# Patient Record
Sex: Male | Born: 1985
Health system: Southern US, Community
[De-identification: ages and names within clinical notes are randomized; demographics above are authoritative.]

## PROBLEM LIST (undated history)

## (undated) DIAGNOSIS — D229 Melanocytic nevi, unspecified: Secondary | ICD-10-CM

## (undated) DIAGNOSIS — B029 Zoster without complications: Secondary | ICD-10-CM

## (undated) HISTORY — DX: Zoster without complications: B02.9

## (undated) HISTORY — DX: Melanocytic nevi, unspecified: D22.9

---

## 2010-05-25 ENCOUNTER — Ambulatory Visit (INDEPENDENT_AMBULATORY_CARE_PROVIDER_SITE_OTHER): Payer: BC Managed Care – PPO

## 2010-05-25 ENCOUNTER — Inpatient Hospital Stay (INDEPENDENT_AMBULATORY_CARE_PROVIDER_SITE_OTHER)
Admission: RE | Admit: 2010-05-25 | Discharge: 2010-05-25 | Disposition: A | Discharge status: Home / Self Care | Payer: BC Managed Care – PPO | Source: Ambulatory Visit | Attending: Family Medicine | Admitting: Family Medicine

## 2010-05-25 DIAGNOSIS — S0280XA Fracture of other specified skull and facial bones, unspecified side, initial encounter for closed fracture: Secondary | ICD-10-CM

## 2010-05-31 ENCOUNTER — Other Ambulatory Visit: Payer: Self-pay | Admitting: Otolaryngology

## 2010-05-31 ENCOUNTER — Ambulatory Visit
Admission: RE | Admit: 2010-05-31 | Discharge: 2010-05-31 | Disposition: A | Discharge status: Home / Self Care | Payer: BC Managed Care – PPO | Source: Ambulatory Visit | Attending: Otolaryngology | Admitting: Otolaryngology

## 2010-05-31 DIAGNOSIS — T148XXA Other injury of unspecified body region, initial encounter: Secondary | ICD-10-CM

## 2010-06-09 ENCOUNTER — Ambulatory Visit (HOSPITAL_BASED_OUTPATIENT_CLINIC_OR_DEPARTMENT_OTHER)
Admission: RE | Admit: 2010-06-09 | Discharge: 2010-06-09 | Disposition: A | Discharge status: Home / Self Care | Payer: BC Managed Care – PPO | Source: Ambulatory Visit | Attending: Otolaryngology | Admitting: Otolaryngology

## 2010-06-09 DIAGNOSIS — W219XXA Striking against or struck by unspecified sports equipment, initial encounter: Secondary | ICD-10-CM | POA: Insufficient documentation

## 2010-06-09 DIAGNOSIS — Z01812 Encounter for preprocedural laboratory examination: Secondary | ICD-10-CM | POA: Insufficient documentation

## 2010-06-09 DIAGNOSIS — Y9364 Activity, baseball: Secondary | ICD-10-CM | POA: Insufficient documentation

## 2010-06-09 DIAGNOSIS — Y998 Other external cause status: Secondary | ICD-10-CM | POA: Insufficient documentation

## 2010-06-09 DIAGNOSIS — S022XXA Fracture of nasal bones, initial encounter for closed fracture: Secondary | ICD-10-CM | POA: Insufficient documentation

## 2010-06-09 DIAGNOSIS — S0280XA Fracture of other specified skull and facial bones, unspecified side, initial encounter for closed fracture: Secondary | ICD-10-CM | POA: Insufficient documentation

## 2010-06-20 NOTE — Op Note (Signed)
  Brian Brady, Brian Brady                  ACCOUNT NO.:  [id number]  MEDICAL RECORD NO.:  [id number]          PATIENT TYPE:  LOCATION:                                 FACILITY:  PHYSICIAN:  Antony Contras, MD     DATE OF BIRTH:  1985-04-19  DATE OF PROCEDURE:  06/09/2010 DATE OF DISCHARGE:                              OPERATIVE REPORT   PREOPERATIVE DIAGNOSES: 1. Displaced nasal fracture. 2. Right infraorbital fracture.  POSTOPERATIVE DIAGNOSES: 1. Displaced nasal fracture. 2. Right infraorbital fracture.  PROCEDURE:  Closed nasal reduction.  SURGEON:  Antony Contras, MD  ANESTHESIA:  General LMA.  COMPLICATIONS:  None.  INDICATIONS:  The patient is a 25 year old white male who was struck in the face by a softball a couple of weeks ago.  He sustained a displaced nasal fracture as well as a partial tripod fracture on the right side. The contour of the maxillary bones appeared to be fairly symmetric, but he does have a step-off of the infraorbital rim.  His nose was displaced toward the left.  After discussing options of what to do with the tripod fracture, the patient has elected to proceed only with closed nasal reduction of the nasal fracture and not going through with open reduction and internal fixation of the maxillary fracture.  Thus, he presents to the operating room for surgical management.  FINDINGS:  The right nasal bone was the depressed creating a left-sided appearance to the nasal dorsum.  The right infraorbital step-off was palpable.  DESCRIPTION OF PROCEDURE:  The patient was identified in the holding room and informed consent having been obtained including discussion of risks, benefits, and alternatives, the patient was brought to the office suite and put on the operating room in supine position.  Anesthesia was induced and the patient was intubated with an LMA without difficulty. The eyes were taped closed and Afrin pledgets were placed on both sides of the  nose.  The right infraorbital fracture was then manipulated with the [facility] elevator in the nose and thumb against the fracture and was able to be reduced somewhat making it less conspicuous.  The pledgets were removed from the nose and the [facility] elevator with bimanual manipulation was used to elevate the right nasal bone back into normal position.  The left bones did not really move.  The right bone though still fell into the nose after manipulating it.  Thus, a small Merocel pack was placed under the nasal bone and inflated with saline.  The external nose was then coated in benzoin followed by a custom cut Steri-Strips and a thermoplastic splint that was cut to fit the nose and placed in hot water until malleable. The nose was suctioned.  The patient was turned to Anesthesia for wake- up.  He was extubated and moved to the recovery room in stable condition.     Antony Contras, MD     DDB/MEDQ  D:  06/09/2010  T:  06/10/2010  Job:  [id number]  Electronically Signed by Christia Reading MD on 06/20/2010 08:51:58 PM

## 2011-02-06 HISTORY — PX: WISDOM TOOTH EXTRACTION: SHX21

## 2012-11-01 IMAGING — CT CT MAXILLOFACIAL W/O CM
3 of 4 series · 16 of 47 positions shown, 19 images · non-contrast
Comparison: Radiography [DATE]

CLINICAL DATA: Right maxillary and nasal fracture.

CT MAXILLOFACIAL WITHOUT CONTRAST
TECHNIQUE: Multidetector CT imaging of the maxillofacial
structures was performed. Multiplanar CT image reconstructions were
also generated.

[Series 3: max bone · axial · 0.33mm/px · z∈[-48,+102]mm · 10 of 72 slices shown, 13 images]
[im 6/72  brain]
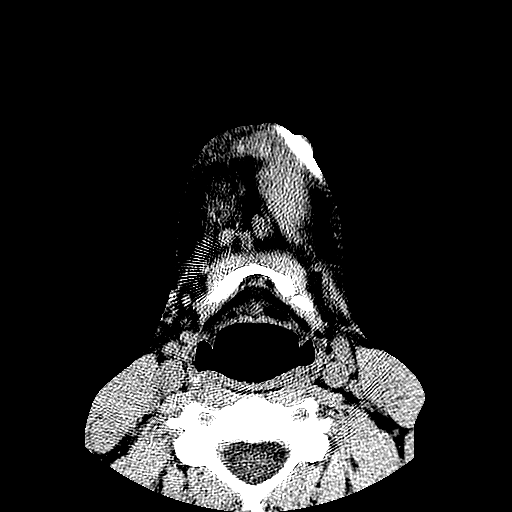
[im 6/72  bone]
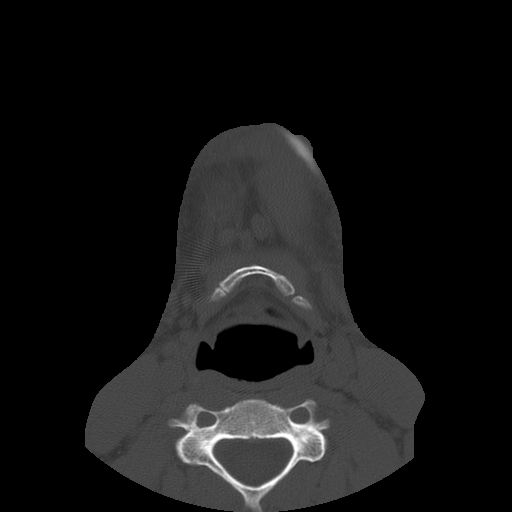
[im 11/72  bone]
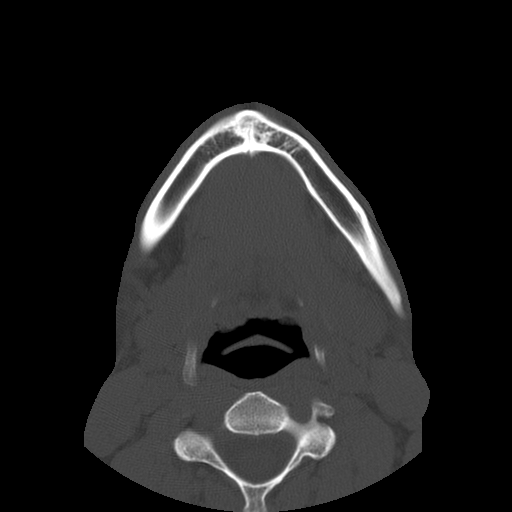
[im 21/72  bone]
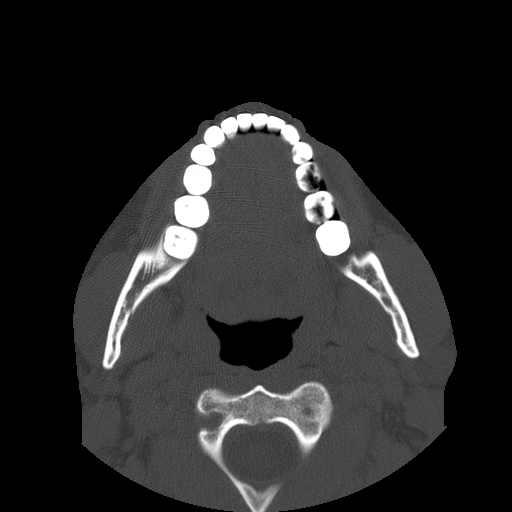
[im 26/72  bone]
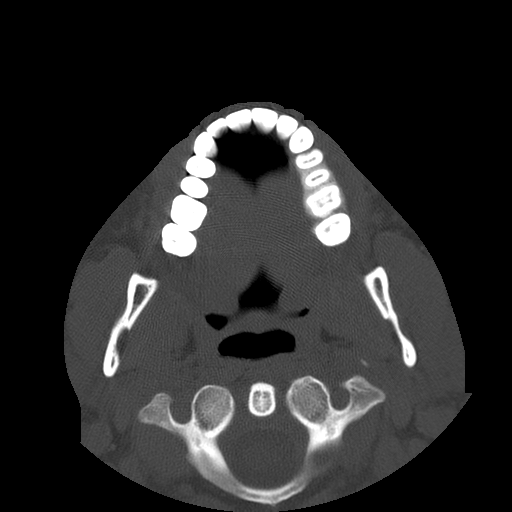
[im 31/72  brain]
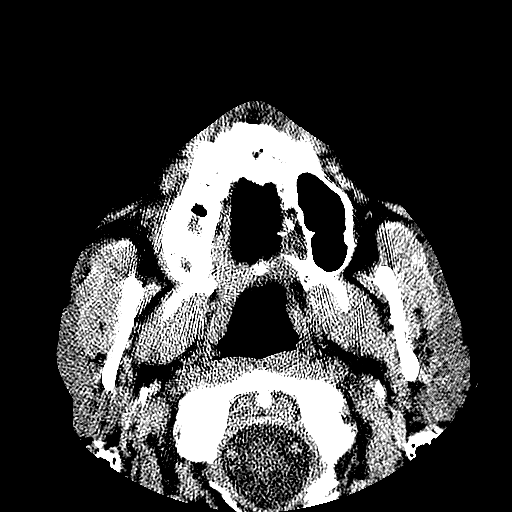
[im 31/72  bone]
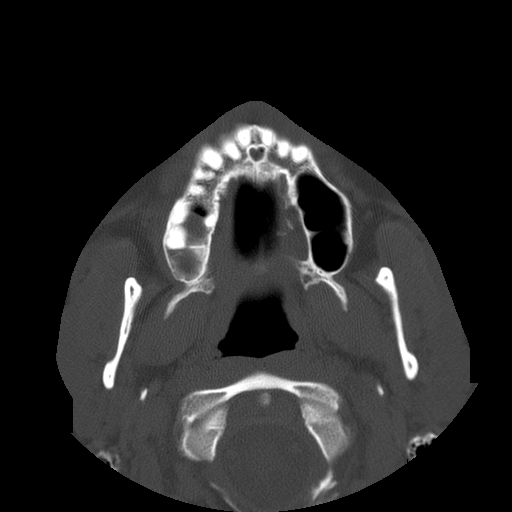
[im 41/72  bone]
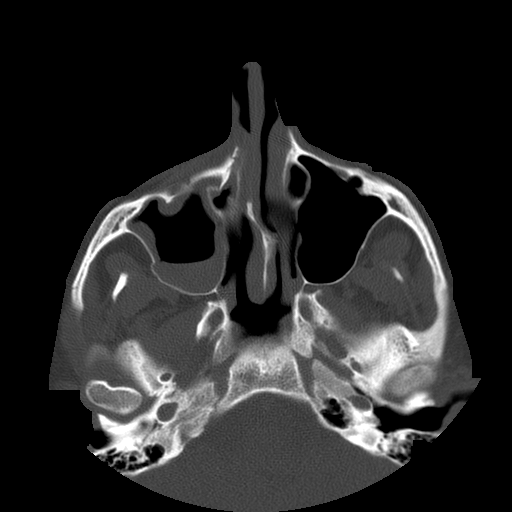
[im 46/72  bone]
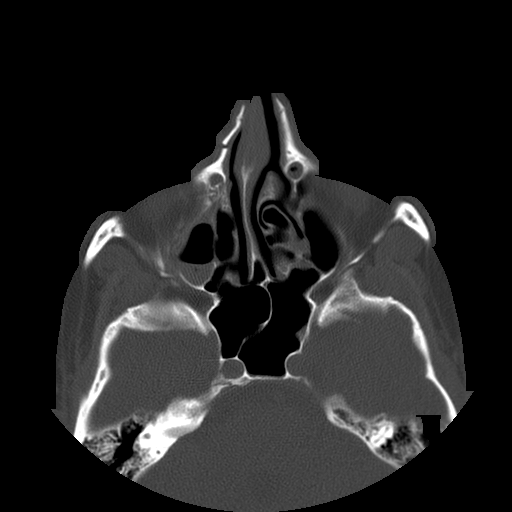
[im 51/72  bone]
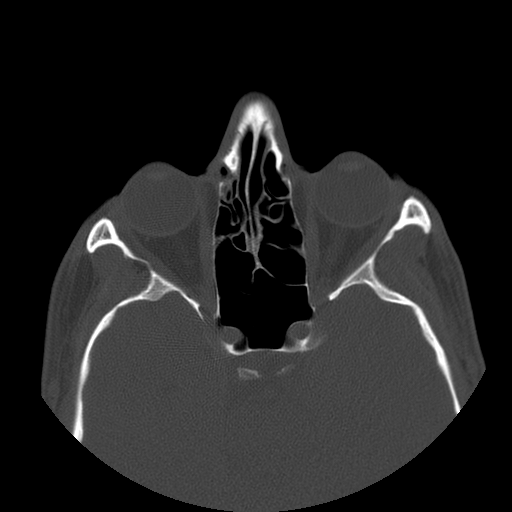
[im 61/72  brain]
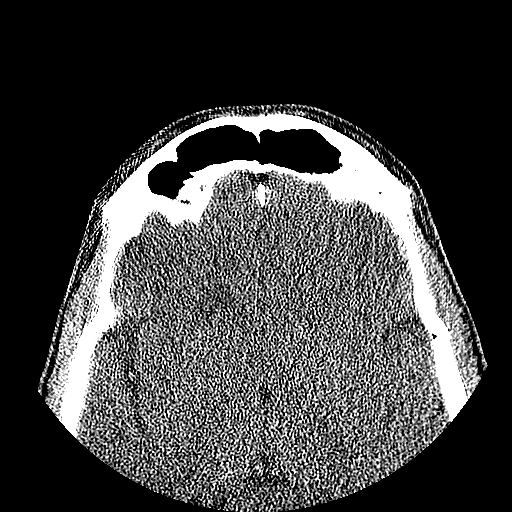
[im 61/72  bone]
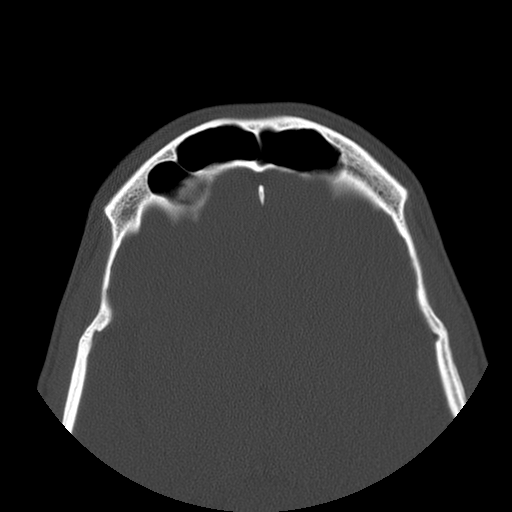
[im 66/72  bone]
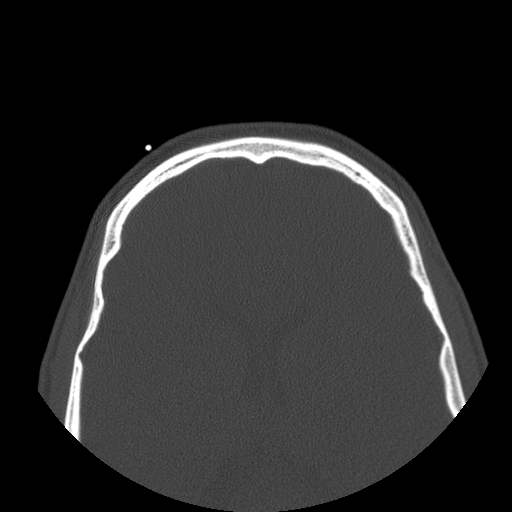

[Series 401: cor · coronal · 0.33mm/px · 3 of 75 slices shown]
[im 25/75  bone]
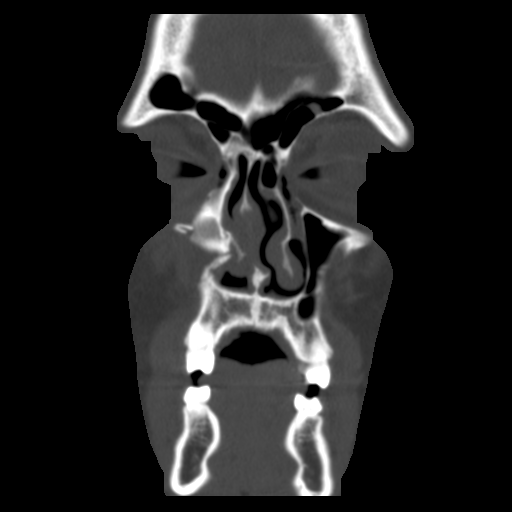
[im 33/75  bone]
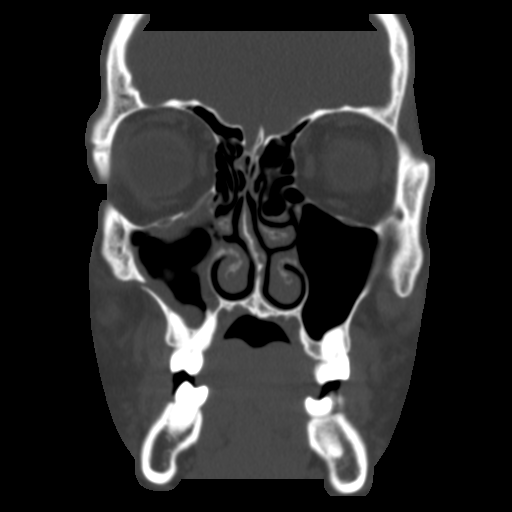
[im 42/75  bone]
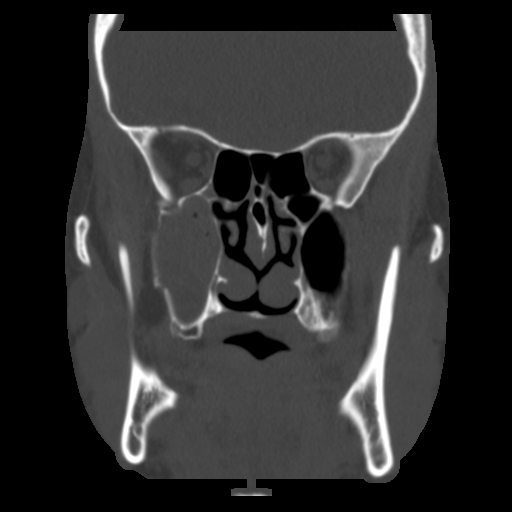

[Series 402: sag · sagittal · 0.33mm/px · 3 of 75 slices shown]
[im 25/75  bone]
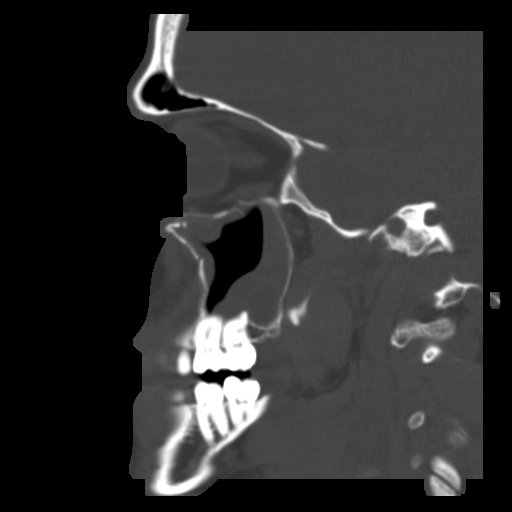
[im 38/75  bone]
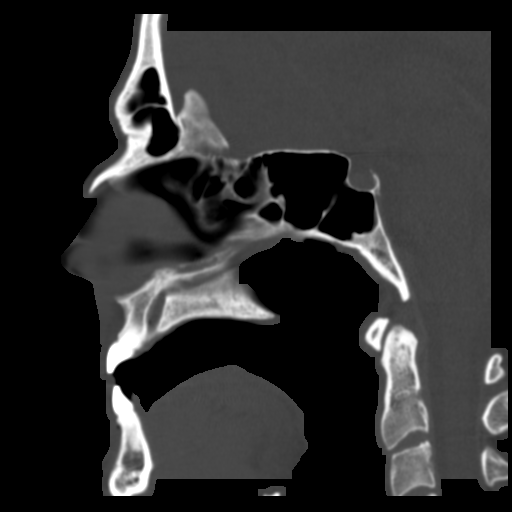
[im 50/75  bone]
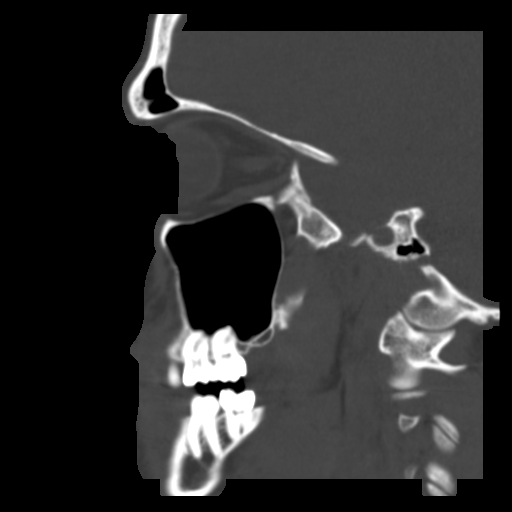

[16 of 47 positions shown; findings below may reference images not displayed]

FINDINGS: There are comminuted fractures of the nasal bones, more
extensive on the right than the left, with some inward displacement
of the nasal bones on the right (maximal 2 mm).

There is a tripod fracture on the right.  There is comminuted
fracture of the anterior wall maxillary sinus with inward
displacement of approximately 5 mm.  Medial and lateral walls of
the maxillary sinus are also fractured and slightly bowed.
Nondisplaced fracture of the zygomatic arch is present .  There is
also an orbital floor fracture without evident trapping.
IMPRESSION: Comminuted fracture of the nasal bones with displacement on the
right.  Comminuted fracture of the nasal maxillary junction region
and anterior wall right maxillary sinus with inward displacement of
5 mm.  Medial and lateral wall maxillary sinus fractures.  Orbital
floor fracture.  Nondisplaced zygomatic arch fracture .

## 2015-02-06 HISTORY — PX: FRACTURE SURGERY: SHX138

## 2016-03-21 DIAGNOSIS — B029 Zoster without complications: Secondary | ICD-10-CM

## 2016-03-21 HISTORY — DX: Zoster without complications: B02.9

## 2016-03-22 ENCOUNTER — Encounter: Payer: Self-pay | Admitting: Physician Assistant

## 2016-03-22 ENCOUNTER — Ambulatory Visit (INDEPENDENT_AMBULATORY_CARE_PROVIDER_SITE_OTHER): Payer: 59 | Admitting: Physician Assistant

## 2016-03-22 VITALS — BP 120/80 | HR 75 | Temp 98.5°F | Ht 68.0 in | Wt 170.0 lb

## 2016-03-22 DIAGNOSIS — B029 Zoster without complications: Secondary | ICD-10-CM

## 2016-03-22 DIAGNOSIS — Z0001 Encounter for general adult medical examination with abnormal findings: Secondary | ICD-10-CM

## 2016-03-22 NOTE — Progress Notes (Addendum)
Subjective:    Patient ID: Brian Brady, male    DOB: 01-08-1986, 31 y.o.   MRN: [id number]  HPI  Mr. Stolar is a 31 y/o male who presents to clinic today to establish care.  Acute Concerns: Shingles -- developed blisters on Super Bowl Sunday on L chest and upper back, complains of slight itchiness and light burning, went to his dermatologist yesterday and was started on Valtrex and given a topical cream to help with his discomfort, he has yet to start this medication but is planning on going to pick it up today  Chronic Issues: none  Health Maintenance: Immunizations -- up to date Diet -- well-balanced, all food groups Exercise -- 3-4 x a week, 20-30 min weights or cardio, weekends active with family/yard Weight -- Weight: 170 lb (77.1 kg) today, 165 lb is normal for him and what his scale says Mood -- generally good, denies issues with depression or anxiety Caffeine -- 12-16 oz coffee daily, does not drink soda  Other providers/specialists: Dermatologist - Merdis Delay at [facility] - 3 moles removed all of them have come back "abnormal, but benign" Dentist - goes regularly Eye - exam was last in 2012, no issues   Review of Systems  Constitutional: Negative for appetite change, fever and unexpected weight change.  HENT: Negative for ear pain, sneezing and sore throat.   Eyes: Negative for visual disturbance.  Respiratory: Negative for apnea, chest tightness and shortness of breath.   Cardiovascular: Negative for chest pain and leg swelling.  Gastrointestinal: Negative for constipation, diarrhea and nausea.  Endocrine: Negative for polydipsia, polyphagia and polyuria.  Genitourinary: Negative for difficulty urinating and urgency.  Musculoskeletal: Negative for myalgias.  Skin: Positive for rash.  Neurological: Negative for dizziness, light-headedness and headaches.  Hematological: Negative for adenopathy. Does not bruise/bleed easily.  Psychiatric/Behavioral:   No issues with depression and anxiety     Past Medical History:  Diagnosis Date  . Atypical mole    followed by dermatology  . Shingles 03/21/2016     Social History   Social History  . Marital status: Married    Spouse name: N/A  . Number of children: N/A  . Years of education: N/A   Occupational History  . Not on file.   Social History Main Topics  . Smoking status: Never Smoker  . Smokeless tobacco: Never Used  . Alcohol use Yes     Comment: 6 drinks a week, no more than 2-3 day  . Drug use: No  . Sexual activity: Yes   Other Topics Concern  . Not on file   Social History Narrative   Goes by [employer]   Married, 73.84 year old daughter   Live in [place]: golf, spending time with daughter    Past Surgical History:  Procedure Laterality Date  . FRACTURE SURGERY  2017   Nose  . WISDOM TOOTH EXTRACTION Bilateral 2013    History reviewed. No pertinent family history.  Allergies  Allergen Reactions  . Amoxicillin     When a baby    No current outpatient prescriptions on file prior to visit.   No current facility-administered medications on file prior to visit.     BP 120/80 (BP Location: Left Arm, Patient Position: Sitting, Cuff Size: Normal)   Pulse 75   Temp 98.5 F (36.9 C) (Oral)   Ht 5\' 8"  (1.727 m)   Wt 170 lb (77.1 kg)   SpO2 97%  BMI 25.85 kg/m      Objective:   Physical Exam  Constitutional: He is oriented to person, place, and time. Vital signs are normal. He appears well-developed and well-nourished.  HENT:  Head: Normocephalic and atraumatic.  Right Ear: Tympanic membrane, external ear and ear canal normal. Tympanic membrane is not erythematous, not retracted and not bulging.  Left Ear: Tympanic membrane, external ear and ear canal normal. Tympanic membrane is not erythematous, not retracted and not bulging.  Nose: Nose normal.  Mouth/Throat: Uvula is midline. No posterior oropharyngeal edema or posterior oropharyngeal  erythema.  Eyes: Pupils are equal, round, and reactive to light.  Neck: Normal range of motion. Neck supple.  Cardiovascular: Normal rate, regular rhythm and normal heart sounds.   Pulmonary/Chest: Effort normal and breath sounds normal.  Abdominal: Soft. Normal appearance and bowel sounds are normal. He exhibits no distension and no mass. There is no tenderness. There is no rebound and no guarding.  Musculoskeletal: Normal range of motion.  Lymphadenopathy:    He has no cervical adenopathy.  Neurological: He is alert and oriented to person, place, and time. He has normal reflexes. No cranial nerve deficit. Coordination normal.  Skin: Skin is warm and dry. Rash (erythematous vesicles to L chest and upper L back, no active drainage/crusting) noted.  Psychiatric: He has a normal mood and affect. His behavior is normal. Judgment and thought content normal.  Nursing note and vitals reviewed.      Assessment & Plan:  1. Encounter for preventative adult health care exam with abnormal findings Today patient counseled on age appropriate routine health concerns for screening and prevention, each reviewed and up to date or declined. Immunizations reviewed and up to date or declined. Labs ordered and reviewed. Risk factors for depression reviewed and negative. Hearing function and visual acuity are intact. ADLs screened and addressed as needed. Functional ability and level of safety reviewed and appropriate. Education, counseling and referrals performed based on assessed risks today. Patient provided with a copy of personalized plan for preventive services. - CBC with Differential/Platelet; Future - Comprehensive metabolic panel; Future - HIV antibody; Future - Lipid panel; Future  2. Herpes zoster without complication Take Valtrex as prescribed by dermatology. Follow-up with [place] or dermatology if concerns with progression of symptoms and effectiveness of treatment.  Inda Coke PA-C 03/22/16

## 2016-03-22 NOTE — Progress Notes (Signed)
Pre visit review using our clinic review tool, if applicable. No additional management support is needed unless otherwise documented below in the visit note. 

## 2016-03-22 NOTE — Patient Instructions (Addendum)
It was great meeting you today!  Please make an appointment with the lab on your way out. I would like for you to return for lab work tomorrow. After midnight, please do not eat anything. You may have water, black coffee, unsweetened tea.  We will call you with your results when they are available.   Health Maintenance, Male A healthy lifestyle and preventative care can promote health and wellness.  Maintain regular health, dental, and eye exams.  Eat a healthy diet. Foods like vegetables, fruits, whole grains, low-fat dairy products, and lean protein foods contain the nutrients you need and are low in calories. Decrease your intake of foods high in solid fats, added sugars, and salt. Get information about a proper diet from your health care provider, if necessary.  Regular physical exercise is one of the most important things you can do for your health. Most adults should get at least 150 minutes of moderate-intensity exercise (any activity that increases your heart rate and causes you to sweat) each week. In addition, most adults need muscle-strengthening exercises on 2 or more days a week.   Maintain a healthy weight. The body mass index (BMI) is a screening tool to identify possible weight problems. It provides an estimate of body fat based on height and weight. Your health care provider can find your BMI and can help you achieve or maintain a healthy weight. For males 20 years and older:  A BMI below 18.5 is considered underweight.  A BMI of 18.5 to 24.9 is normal.  A BMI of 25 to 29.9 is considered overweight.  A BMI of 30 and above is considered obese.  Maintain normal blood lipids and cholesterol by exercising and minimizing your intake of saturated fat. Eat a balanced diet with plenty of fruits and vegetables. Blood tests for lipids and cholesterol should begin at age 20 and be repeated every 5 years. If your lipid or cholesterol levels are high, you are over age 24, or you are at  high risk for heart disease, you may need your cholesterol levels checked more frequently.Ongoing high lipid and cholesterol levels should be treated with medicines if diet and exercise are not working.  If you smoke, find out from your health care provider how to quit. If you do not use tobacco, do not start.  Lung cancer screening is recommended for adults aged 79-80 years who are at high risk for developing lung cancer because of a history of smoking. A yearly low-dose CT scan of the lungs is recommended for people who have at least a 30-pack-year history of smoking and are current smokers or have quit within the past 15 years. A pack year of smoking is smoking an average of 1 pack of cigarettes a day for 1 year (for example, a 30-pack-year history of smoking could mean smoking 1 pack a day for 30 years or 2 packs a day for 15 years). Yearly screening should continue until the smoker has stopped smoking for at least 15 years. Yearly screening should be stopped for people who develop a health problem that would prevent them from having lung cancer treatment.  If you choose to drink alcohol, do not have more than 2 drinks per day. One drink is considered to be 12 oz (360 mL) of beer, 5 oz (150 mL) of wine, or 1.5 oz (45 mL) of liquor.  Avoid the use of street drugs. Do not share needles with anyone. Ask for help if you need support or instructions  about stopping the use of drugs.  High blood pressure causes heart disease and increases the risk of stroke. High blood pressure is more likely to develop in:  People who have blood pressure in the end of the normal range (100-139/85-89 mm Hg).  People who are overweight or obese.  People who are African American.  If you are 52-73 years of age, have your blood pressure checked every 3-5 years. If you are 67 years of age or older, have your blood pressure checked every year. You should have your blood pressure measured twice-once when you are at a  hospital or clinic, and once when you are not at a hospital or clinic. Record the average of the two measurements. To check your blood pressure when you are not at a hospital or clinic, you can use:  An automated blood pressure machine at a pharmacy.  A home blood pressure monitor.  If you are 20-5 years old, ask your health care provider if you should take aspirin to prevent heart disease.  Diabetes screening involves taking a blood sample to check your fasting blood sugar level. This should be done once every 3 years after age 3 if you are at a normal weight and without risk factors for diabetes. Testing should be considered at a younger age or be carried out more frequently if you are overweight and have at least 1 risk factor for diabetes.  Colorectal cancer can be detected and often prevented. Most routine colorectal cancer screening begins at the age of 44 and continues through age 24. However, your health care provider may recommend screening at an earlier age if you have risk factors for colon cancer. On a yearly basis, your health care provider may provide home test kits to check for hidden blood in the stool. A small camera at the end of a tube may be used to directly examine the colon (sigmoidoscopy or colonoscopy) to detect the earliest forms of colorectal cancer. Talk to your health care provider about this at age 34 when routine screening begins. A direct exam of the colon should be repeated every 5-10 years through age 58, unless early forms of precancerous polyps or small growths are found.  People who are at an increased risk for hepatitis B should be screened for this virus. You are considered at high risk for hepatitis B if:  You were born in a country where hepatitis B occurs often. Talk with your health care provider about which countries are considered high risk.  Your parents were born in a high-risk country and you have not received a shot to protect against hepatitis B  (hepatitis B vaccine).  You have HIV or AIDS.  You use needles to inject street drugs.  You live with, or have sex with, someone who has hepatitis B.  You are a man who has sex with other men (MSM).  You get hemodialysis treatment.  You take certain medicines for conditions like cancer, organ transplantation, and autoimmune conditions.  Hepatitis C blood testing is recommended for all people born from [birth year] through 1965 and any individual with known risk factors for hepatitis C.  Healthy men should no longer receive prostate-specific antigen (PSA) blood tests as part of routine cancer screening. Talk to your health care provider about prostate cancer screening.  Testicular cancer screening is not recommended for adolescents or adult males who have no symptoms. Screening includes self-exam, a health care provider exam, and other screening tests. Consult with your health care provider  about any symptoms you have or any concerns you have about testicular cancer.  Practice safe sex. Use condoms and avoid high-risk sexual practices to reduce the spread of sexually transmitted infections (STIs).  You should be screened for STIs, including gonorrhea and chlamydia if:  You are sexually active and are younger than 24 years.  You are older than 24 years, and your health care provider tells you that you are at risk for this type of infection.  Your sexual activity has changed since you were last screened, and you are at an increased risk for chlamydia or gonorrhea. Ask your health care provider if you are at risk.  If you are at risk of being infected with HIV, it is recommended that you take a prescription medicine daily to prevent HIV infection. This is called pre-exposure prophylaxis (PrEP). You are considered at risk if:  You are a man who has sex with other men (MSM).  You are a heterosexual man who is sexually active with multiple partners.  You take drugs by injection.  You are  sexually active with a partner who has HIV.  Talk with your health care provider about whether you are at high risk of being infected with HIV. If you choose to begin PrEP, you should first be tested for HIV. You should then be tested every 3 months for as long as you are taking PrEP.  Use sunscreen. Apply sunscreen liberally and repeatedly throughout the day. You should seek shade when your shadow is shorter than you. Protect yourself by wearing long sleeves, pants, a wide-brimmed hat, and sunglasses year round whenever you are outdoors.  Tell your health care provider of new moles or changes in moles, especially if there is a change in shape or color. Also, tell your health care provider if a mole is larger than the size of a pencil eraser.  A one-time screening for abdominal aortic aneurysm (AAA) and surgical repair of large AAAs by ultrasound is recommended for men aged 27-75 years who are current or former smokers.  Stay current with your vaccines (immunizations). This information is not intended to replace advice given to you by your health care provider. Make sure you discuss any questions you have with your health care provider. Document Released: 07/21/2007 Document Revised: 02/12/2014 Document Reviewed: 10/26/2014 Elsevier Interactive Patient Education  2017 [employer].

## 2016-03-27 ENCOUNTER — Other Ambulatory Visit (INDEPENDENT_AMBULATORY_CARE_PROVIDER_SITE_OTHER): Payer: 59

## 2016-03-27 DIAGNOSIS — Z0001 Encounter for general adult medical examination with abnormal findings: Secondary | ICD-10-CM | POA: Diagnosis not present

## 2016-03-27 LAB — CBC WITH DIFFERENTIAL/PLATELET
BASOS ABS: 0 10*3/uL (ref 0.0–0.1)
Basophils Relative: 0.8 % (ref 0.0–3.0)
EOS PCT: 0.9 % (ref 0.0–5.0)
Eosinophils Absolute: 0 10*3/uL (ref 0.0–0.7)
HCT: 44.6 % (ref 39.0–52.0)
HEMOGLOBIN: 15.6 g/dL (ref 13.0–17.0)
LYMPHS ABS: 1.8 10*3/uL (ref 0.7–4.0)
Lymphocytes Relative: 33 % (ref 12.0–46.0)
MCHC: 35 g/dL (ref 30.0–36.0)
MCV: 86.4 fl (ref 78.0–100.0)
MONOS PCT: 8.3 % (ref 3.0–12.0)
Monocytes Absolute: 0.4 10*3/uL (ref 0.1–1.0)
NEUTROS PCT: 57 % (ref 43.0–77.0)
Neutro Abs: 3 10*3/uL (ref 1.4–7.7)
Platelets: 189 10*3/uL (ref 150.0–400.0)
RBC: 5.16 Mil/uL (ref 4.22–5.81)
RDW: 12.6 % (ref 11.5–15.5)
WBC: 5.3 10*3/uL (ref 4.0–10.5)

## 2016-03-27 LAB — LIPID PANEL
CHOLESTEROL: 188 mg/dL (ref 0–200)
HDL: 51.3 mg/dL (ref >39.00)
LDL Cholesterol: 123 mg/dL — ABNORMAL HIGH (ref 0–99)
NONHDL: 136.88
Total CHOL/HDL Ratio: 4
Triglycerides: 68 mg/dL (ref 0.0–149.0)
VLDL: 13.6 mg/dL (ref 0.0–40.0)

## 2016-03-27 LAB — COMPREHENSIVE METABOLIC PANEL
ALBUMIN: 4.4 g/dL (ref 3.5–5.2)
ALK PHOS: 40 U/L (ref 39–117)
ALT: 11 U/L (ref 0–53)
AST: 17 U/L (ref 0–37)
BILIRUBIN TOTAL: 0.6 mg/dL (ref 0.2–1.2)
BUN: 15 mg/dL (ref 6–23)
CO2: 32 mEq/L (ref 19–32)
Calcium: 9.4 mg/dL (ref 8.4–10.5)
Chloride: 103 mEq/L (ref 96–112)
Creatinine, Ser: 0.95 mg/dL (ref 0.40–1.50)
GFR: 98.2 mL/min (ref >60.00)
GLUCOSE: 82 mg/dL (ref 70–99)
Potassium: 4.2 mEq/L (ref 3.5–5.1)
SODIUM: 139 meq/L (ref 135–145)
Total Protein: 7 g/dL (ref 6.0–8.3)

## 2016-03-28 LAB — HIV ANTIBODY (ROUTINE TESTING W REFLEX): HIV 1&2 Ab, 4th Generation: NONREACTIVE

## 2017-04-16 ENCOUNTER — Encounter: Payer: Self-pay | Admitting: Physician Assistant

## 2017-04-16 ENCOUNTER — Ambulatory Visit (INDEPENDENT_AMBULATORY_CARE_PROVIDER_SITE_OTHER): Payer: 59 | Admitting: Physician Assistant

## 2017-04-16 VITALS — BP 120/84 | HR 63 | Temp 97.8°F | Ht 69.5 in | Wt 169.0 lb

## 2017-04-16 DIAGNOSIS — E785 Hyperlipidemia, unspecified: Secondary | ICD-10-CM | POA: Diagnosis not present

## 2017-04-16 DIAGNOSIS — Z0000 Encounter for general adult medical examination without abnormal findings: Secondary | ICD-10-CM

## 2017-04-16 LAB — COMPREHENSIVE METABOLIC PANEL
ALBUMIN: 4.5 g/dL (ref 3.5–5.2)
ALK PHOS: 42 U/L (ref 39–117)
ALT: 15 U/L (ref 0–53)
AST: 15 U/L (ref 0–37)
BUN: 12 mg/dL (ref 6–23)
CALCIUM: 10 mg/dL (ref 8.4–10.5)
CHLORIDE: 100 meq/L (ref 96–112)
CO2: 32 mEq/L (ref 19–32)
CREATININE: 0.9 mg/dL (ref 0.40–1.50)
GFR: 103.82 mL/min (ref >60.00)
Glucose, Bld: 86 mg/dL (ref 70–99)
POTASSIUM: 4 meq/L (ref 3.5–5.1)
SODIUM: 138 meq/L (ref 135–145)
TOTAL PROTEIN: 7.4 g/dL (ref 6.0–8.3)
Total Bilirubin: 0.7 mg/dL (ref 0.2–1.2)

## 2017-04-16 LAB — CBC WITH DIFFERENTIAL/PLATELET
BASOS PCT: 0.7 % (ref 0.0–3.0)
Basophils Absolute: 0 10*3/uL (ref 0.0–0.1)
EOS PCT: 0.6 % (ref 0.0–5.0)
Eosinophils Absolute: 0 10*3/uL (ref 0.0–0.7)
HEMATOCRIT: 47.6 % (ref 39.0–52.0)
HEMOGLOBIN: 16.4 g/dL (ref 13.0–17.0)
LYMPHS PCT: 32.3 % (ref 12.0–46.0)
Lymphs Abs: 1.9 10*3/uL (ref 0.7–4.0)
MCHC: 34.5 g/dL (ref 30.0–36.0)
MCV: 87.3 fl (ref 78.0–100.0)
Monocytes Absolute: 0.5 10*3/uL (ref 0.1–1.0)
Monocytes Relative: 8.6 % (ref 3.0–12.0)
Neutro Abs: 3.5 10*3/uL (ref 1.4–7.7)
Neutrophils Relative %: 57.8 % (ref 43.0–77.0)
Platelets: 184 10*3/uL (ref 150.0–400.0)
RBC: 5.45 Mil/uL (ref 4.22–5.81)
RDW: 13.1 % (ref 11.5–15.5)
WBC: 6 10*3/uL (ref 4.0–10.5)

## 2017-04-16 LAB — LIPID PANEL
CHOLESTEROL: 196 mg/dL (ref 0–200)
HDL: 54.9 mg/dL (ref >39.00)
LDL CALC: 122 mg/dL — AB (ref 0–99)
NonHDL: 141.28
TRIGLYCERIDES: 95 mg/dL (ref 0.0–149.0)
Total CHOL/HDL Ratio: 4
VLDL: 19 mg/dL (ref 0.0–40.0)

## 2017-04-16 NOTE — Patient Instructions (Signed)
It was great to see you!   Health Maintenance, Male A healthy lifestyle and preventive care is important for your health and wellness. Ask your health care provider about what schedule of regular examinations is right for you. What should I know about weight and diet? Eat a Healthy Diet  Eat plenty of vegetables, fruits, whole grains, low-fat dairy products, and lean protein.  Do not eat a lot of foods high in solid fats, added sugars, or salt.  Maintain a Healthy Weight Regular exercise can help you achieve or maintain a healthy weight. You should:  Do at least 150 minutes of exercise each week. The exercise should increase your heart rate and make you sweat (moderate-intensity exercise).  Do strength-training exercises at least twice a week.  Watch Your Levels of Cholesterol and Blood Lipids  Have your blood tested for lipids and cholesterol every 5 years starting at 32 years of age. If you are at high risk for heart disease, you should start having your blood tested when you are 32 years old. You may need to have your cholesterol levels checked more often if: ? Your lipid or cholesterol levels are high. ? You are older than 32 years of age. ? You are at high risk for heart disease.  What should I know about cancer screening? Many types of cancers can be detected early and may often be prevented. Lung Cancer  You should be screened every year for lung cancer if: ? You are a current smoker who has smoked for at least 30 years. ? You are a former smoker who has quit within the past 15 years.  Talk to your health care provider about your screening options, when you should start screening, and how often you should be screened.  Colorectal Cancer  Routine colorectal cancer screening usually begins at 32 years of age and should be repeated every 5-10 years until you are 32 years old. You may need to be screened more often if early forms of precancerous polyps or small growths are  found. Your health care provider may recommend screening at an earlier age if you have risk factors for colon cancer.  Your health care provider may recommend using home test kits to check for hidden blood in the stool.  A small camera at the end of a tube can be used to examine your colon (sigmoidoscopy or colonoscopy). This checks for the earliest forms of colorectal cancer.  Prostate and Testicular Cancer  Depending on your age and overall health, your health care provider may do certain tests to screen for prostate and testicular cancer.  Talk to your health care provider about any symptoms or concerns you have about testicular or prostate cancer.  Skin Cancer  Check your skin from head to toe regularly.  Tell your health care provider about any new moles or changes in moles, especially if: ? There is a change in a mole's size, shape, or color. ? You have a mole that is larger than a pencil eraser.  Always use sunscreen. Apply sunscreen liberally and repeat throughout the day.  Protect yourself by wearing long sleeves, pants, a wide-brimmed hat, and sunglasses when outside.  What should I know about heart disease, diabetes, and high blood pressure?  If you are 33-18 years of age, have your blood pressure checked every 3-5 years. If you are 74 years of age or older, have your blood pressure checked every year. You should have your blood pressure measured twice-once when you are  at a hospital or clinic, and once when you are not at a hospital or clinic. Record the average of the two measurements. To check your blood pressure when you are not at a hospital or clinic, you can use: ? An automated blood pressure machine at a pharmacy. ? A home blood pressure monitor.  Talk to your health care provider about your target blood pressure.  If you are between 45-79 years old, ask your health care provider if you should take aspirin to prevent heart disease.  Have regular diabetes  screenings by checking your fasting blood sugar level. ? If you are at a normal weight and have a low risk for diabetes, have this test once every three years after the age of 45. ? If you are overweight and have a high risk for diabetes, consider being tested at a younger age or more often.  A one-time screening for abdominal aortic aneurysm (AAA) by ultrasound is recommended for men aged 65-75 years who are current or former smokers. What should I know about preventing infection? Hepatitis B If you have a higher risk for hepatitis B, you should be screened for this virus. Talk with your health care provider to find out if you are at risk for hepatitis B infection. Hepatitis C Blood testing is recommended for:  Everyone born from 1945 through 1965.  Anyone with known risk factors for hepatitis C.  Sexually Transmitted Diseases (STDs)  You should be screened each year for STDs including gonorrhea and chlamydia if: ? You are sexually active and are younger than 32 years of age. ? You are older than 32 years of age and your health care provider tells you that you are at risk for this type of infection. ? Your sexual activity has changed since you were last screened and you are at an increased risk for chlamydia or gonorrhea. Ask your health care provider if you are at risk.  Talk with your health care provider about whether you are at high risk of being infected with HIV. Your health care provider may recommend a prescription medicine to help prevent HIV infection.  What else can I do?  Schedule regular health, dental, and eye exams.  Stay current with your vaccines (immunizations).  Do not use any tobacco products, such as cigarettes, chewing tobacco, and e-cigarettes. If you need help quitting, ask your health care provider.  Limit alcohol intake to no more than 2 drinks per day. One drink equals 12 ounces of beer, 5 ounces of wine, or 1 ounces of hard liquor.  Do not use street  drugs.  Do not share needles.  Ask your health care provider for help if you need support or information about quitting drugs.  Tell your health care provider if you often feel depressed.  Tell your health care provider if you have ever been abused or do not feel safe at home. This information is not intended to replace advice given to you by your health care provider. Make sure you discuss any questions you have with your health care provider. Document Released: 07/21/2007 Document Revised: 09/21/2015 Document Reviewed: 10/26/2014 Elsevier Interactive Patient Education  2018 Elsevier Inc.  

## 2017-04-16 NOTE — Progress Notes (Signed)
I acted as a Education administrator for [employer], PA-C Anselmo Pickler, LPN   Subjective:    Brian Brady is a 32 y.o. male and is here for a comprehensive physical exam.  HPI  There are no preventive care reminders to display for this patient.  Acute Concerns: No health concerns today  Chronic Issues: Hyperlipidemia -- most recent LDL was 123 last year, continues to work on healthy diet and exercise  Health Maintenance: Immunizations -- up to date Colonoscopy -- N/A PSA -- N/A Diet -- eats a balanced diet, eat all food groups Caffeine intake -- 1 cup of coffee daily Sleep habits -- 7-8 hours Exercise -- 3-4 x week x 30 min; weekends in the park or playing golf Weight -- Weight: 169 lb (76.7 kg)  Mood -- no issues   Depression screen PHQ 2/9 04/16/2017  Decreased Interest 0  Down, Depressed, Hopeless 0  PHQ - 2 Score 0   Other providers/specialists: Dermatology -- up to date, goes for yearly skin checks Dentist -- up to date  PMHx, SurgHx, SocialHx, Medications, and Allergies were reviewed in the Visit Navigator and updated as appropriate.   Past Medical History:  Diagnosis Date  . Atypical mole    followed by dermatology  . Shingles 03/21/2016     Past Surgical History:  Procedure Laterality Date  . FRACTURE SURGERY  2017   Nose  . WISDOM TOOTH EXTRACTION Bilateral 2013     Family History  Problem Relation Age of Onset  . Colon cancer Neg Hx   . Prostate cancer Neg Hx     Social History   Tobacco Use  . Smoking status: Never Smoker  . Smokeless tobacco: Never Used  Substance Use Topics  . Alcohol use: Yes    Comment: 6 drinks a week, no more than 2-3 day  . Drug use: No    Review of Systems:   Review of Systems  Constitutional: Negative.  Negative for chills, fever, malaise/fatigue and weight loss.  HENT: Negative.  Negative for hearing loss, sinus pain and sore throat.   Eyes: Negative.  Negative for blurred vision.  Respiratory: Negative.   Negative for cough and shortness of breath.   Cardiovascular: Negative.  Negative for chest pain, palpitations and leg swelling.  Gastrointestinal: Negative.  Negative for abdominal pain, constipation, diarrhea, heartburn, nausea and vomiting.  Genitourinary: Negative.  Negative for dysuria, frequency and urgency.  Musculoskeletal: Negative.  Negative for back pain, myalgias and neck pain.  Skin: Negative.  Negative for itching and rash.  Neurological: Negative.  Negative for dizziness, tingling, seizures, loss of consciousness and headaches.  Endo/Heme/Allergies: Negative.  Negative for polydipsia.  Psychiatric/Behavioral: Negative.  Negative for depression. The patient is not nervous/anxious.     Objective:   Vitals:   04/16/17 0832  BP: 120/84  Pulse: 63  Temp: 97.8 F (36.6 C)  SpO2: 99%   Body mass index is 24.6 kg/m.  General Appearance:  Alert, cooperative, no distress, appears stated age  Head:  Normocephalic, without obvious abnormality, atraumatic  Eyes:  PERRL, conjunctiva/corneas clear, EOM's intact, fundi benign, both eyes       Ears:  Normal TM's and external ear canals, both ears  Nose: Nares normal, septum midline, mucosa normal, no drainage    or sinus tenderness  Throat: Lips, mucosa, and tongue normal; teeth and gums normal  Neck: Supple, symmetrical, trachea midline, no adenopathy; thyroid:  No enlargement/tenderness/nodules; no carotit bruit or JVD  Back:   Symmetric, no curvature,  ROM normal, no CVA tenderness  Lungs:   Clear to auscultation bilaterally, respirations unlabored  Chest wall:  No tenderness or deformity  Heart:  Regular rate and rhythm, S1 and S2 normal, no murmur, rub   or gallop  Abdomen:   Soft, non-tender, bowel sounds active all four quadrants, no masses, no organomegaly  Extremities: Extremities normal, atraumatic, no cyanosis or edema  Prostate: Not done.   Skin: Skin color, texture, turgor normal, no rashes or lesions  Lymph nodes:  Cervical, supraclavicular, and axillary nodes normal  Neurologic: CNII-XII grossly intact. Normal strength, sensation and reflexes throughout    Assessment/Plan:   [name] was seen today for annual exam.  Diagnoses and all orders for this visit:  Routine general medical examination at a health care facility Today patient counseled on age appropriate routine health concerns for screening and prevention, each reviewed and up to date or declined. Immunizations reviewed and up to date or declined. Labs ordered and reviewed. Risk factors for depression reviewed and negative. Hearing function and visual acuity are intact. ADLs screened and addressed as needed. Functional ability and level of safety reviewed and appropriate. Education, counseling and referrals performed based on assessed risks today. Patient provided with a copy of personalized plan for preventive services. -     CBC with Differential/Platelet -     Comprehensive metabolic panel -     Lipid panel  Hyperlipidemia, unspecified hyperlipidemia type Re-check today. -     CBC with Differential/Platelet -     Comprehensive metabolic panel -     Lipid panel  Well Adult Exam: Labs ordered: Yes. Patient counseling was done. See below for items discussed. Discussed the patient's BMI.  The BMI BMI is in the acceptable range Follow up in one year.  Patient Counseling: [x]   Nutrition: Stressed importance of moderation in sodium/caffeine intake, saturated fat and cholesterol, caloric balance, sufficient intake of fresh fruits, vegetables, and fiber.  [x]   Stressed the importance of regular exercise.   []   Substance Abuse: Discussed cessation/primary prevention of tobacco, alcohol, or other drug use; driving or other dangerous activities under the influence; availability of treatment for abuse.   [x]   Injury prevention: Discussed safety belts, safety helmets, smoke detector, smoking near bedding or upholstery.   []   Sexuality: Discussed sexually  transmitted diseases, partner selection, use of condoms, avoidance of unintended pregnancy  and contraceptive alternatives.   [x]   Dental health: Discussed importance of regular tooth brushing, flossing, and dental visits.  [x]   Health maintenance and immunizations reviewed. Please refer to Health maintenance section.     CMA or LPN served as scribe during this visit. History, Physical, and Plan performed by medical provider. Documentation and orders reviewed and attested to.  Inda Coke, PA-C [place]

## 2017-09-15 ENCOUNTER — Emergency Department (HOSPITAL_COMMUNITY)
Admission: EM | Admit: 2017-09-15 | Discharge: 2017-09-15 | Disposition: A | Discharge status: Home / Self Care | Payer: 59 | Attending: Emergency Medicine | Admitting: Emergency Medicine

## 2017-09-15 ENCOUNTER — Other Ambulatory Visit: Payer: Self-pay

## 2017-09-15 ENCOUNTER — Encounter (HOSPITAL_COMMUNITY): Payer: Self-pay | Admitting: Emergency Medicine

## 2017-09-15 DIAGNOSIS — S61314A Laceration without foreign body of right ring finger with damage to nail, initial encounter: Secondary | ICD-10-CM | POA: Insufficient documentation

## 2017-09-15 DIAGNOSIS — Y939 Activity, unspecified: Secondary | ICD-10-CM | POA: Insufficient documentation

## 2017-09-15 DIAGNOSIS — Z79899 Other long term (current) drug therapy: Secondary | ICD-10-CM | POA: Insufficient documentation

## 2017-09-15 DIAGNOSIS — S61316A Laceration without foreign body of right little finger with damage to nail, initial encounter: Secondary | ICD-10-CM | POA: Diagnosis not present

## 2017-09-15 DIAGNOSIS — Y929 Unspecified place or not applicable: Secondary | ICD-10-CM | POA: Diagnosis not present

## 2017-09-15 DIAGNOSIS — W268XXA Contact with other sharp object(s), not elsewhere classified, initial encounter: Secondary | ICD-10-CM | POA: Insufficient documentation

## 2017-09-15 DIAGNOSIS — S6991XA Unspecified injury of right wrist, hand and finger(s), initial encounter: Secondary | ICD-10-CM | POA: Diagnosis present

## 2017-09-15 DIAGNOSIS — Y999 Unspecified external cause status: Secondary | ICD-10-CM | POA: Diagnosis not present

## 2017-09-15 MED ORDER — TETANUS-DIPHTH-ACELL PERTUSSIS 5-2.5-18.5 LF-MCG/0.5 IM SUSP: 0.5000 mL | Freq: Once | INTRAMUSCULAR | Status: DC

## 2017-09-15 NOTE — ED Provider Notes (Signed)
[facility] EMERGENCY DEPART[facility] Provider Note   CSN: [id number] Arrival date & time: 09/15/17  1849     History   Chief Complaint Chief Complaint  Patient presents with  . Finger Injury    HPI Marek Nghiem is a 32 y.o. male.  The history is provided by the patient. No language interpreter was used.  Laceration   The incident occurred less than 1 hour ago. The laceration is located on the right hand. The laceration is 1 cm in size. The laceration mechanism was a a metal edge. The pain is mild. The pain has been constant since onset. It is unknown if a foreign body is present. His tetanus status is UTD.  Pt cut his finger on a madalin  Past Medical History:  Diagnosis Date  . Atypical mole    followed by dermatology  . Shingles 03/21/2016    There are no active problems to display for this patient.   Past Surgical History:  Procedure Laterality Date  . FRACTURE SURGERY  2017   Nose  . WISDOM TOOTH EXTRACTION Bilateral 2013        Home Medications    Prior to Admission medications   Medication Sig Start Date End Date Taking? Authorizing Provider  Multiple Vitamin (MULTIVITAMIN) tablet Take 1 tablet by mouth daily.    [provider]  Omega-3 Fatty Acids (FISH OIL PO) Take by mouth.    [provider]    Family History Family History  Problem Relation Age of Onset  . Colon cancer Neg Hx   . Prostate cancer Neg Hx     Social History Social History   Tobacco Use  . Smoking status: Never Smoker  . Smokeless tobacco: Never Used  Substance Use Topics  . Alcohol use: Yes    Comment: 6 drinks a week, no more than 2-3 day  . Drug use: No     Allergies   Amoxicillin   Review of Systems Review of Systems  All other systems reviewed and are negative.    Physical Exam Updated Vital Signs BP 133/89 (BP Location: Right Arm)   Pulse 73   Temp 98 F (36.7 C) (Oral)   Resp 16   Ht 5\' 10"  (1.778 m)   Wt 72.6 kg    SpO2 100%   BMI 22.96 kg/m   Physical Exam  Constitutional: He appears well-developed and well-nourished.  HENT:  Head: Normocephalic and atraumatic.  Musculoskeletal: He exhibits tenderness.  Neurological: He is alert.  Skin: Skin is warm.  73mm laceration distal tip of 4th finger,  82mm laceration distal 5th finger through nail,  Both oozing.    Nursing note and vitals reviewed.    ED Treatments / Results  Labs (all labs ordered are listed, but only abnormal results are displayed) Labs Reviewed - No data to display  EKG None  Radiology No results found.  Procedures Procedures (including critical care time)  Medications Ordered in ED Medications  Tdap (BOOSTRIX) injection 0.5 mL (has no administration in time range)     Initial Impression / Assessment and Plan / ED Course  I have reviewed the triage vital signs and the nursing notes.  Pertinent labs & imaging results that were available during my care of the patient were reviewed by me and considered in my medical decision making (see chart for details).     Quick clot applied to wounds.  Bleeding controlled.  Pt advised to change dressing in 24 hours.  Pt  advised to leave nail and small flap in place.  (to superficial to suture)   Final Clinical Impressions(s) / ED Diagnoses   Final diagnoses:  Laceration of right ring finger, foreign body presence unspecified, nail damage status unspecified, initial encounter    ED Discharge Orders    None       Sidney Ace 09/15/17 2133    Maudie Flakes, MD 09/16/17 [phone]

## 2017-09-15 NOTE — ED Triage Notes (Signed)
Patient stating he was food prepping and sliced his right pinky and right ring finger. Bleeding controlled. No other complaints.

## 2017-09-15 NOTE — ED Notes (Signed)
Patient is A&Ox4 at this time.  Patient in no signs of distress.  Please see providers note for complete history and physical exam.  

## 2017-09-15 NOTE — Discharge Instructions (Addendum)
Return if any problems.

## 2018-04-18 ENCOUNTER — Encounter: Payer: 59 | Admitting: Physician Assistant

## 2018-04-21 ENCOUNTER — Encounter: Payer: Self-pay | Admitting: Physician Assistant

## 2018-04-21 ENCOUNTER — Other Ambulatory Visit: Payer: Self-pay

## 2018-04-21 ENCOUNTER — Ambulatory Visit (INDEPENDENT_AMBULATORY_CARE_PROVIDER_SITE_OTHER): Payer: 59 | Admitting: Physician Assistant

## 2018-04-21 VITALS — BP 120/84 | HR 72 | Temp 98.0°F | Ht 69.5 in | Wt 162.0 lb

## 2018-04-21 DIAGNOSIS — Z0000 Encounter for general adult medical examination without abnormal findings: Secondary | ICD-10-CM

## 2018-04-21 DIAGNOSIS — E785 Hyperlipidemia, unspecified: Secondary | ICD-10-CM | POA: Diagnosis not present

## 2018-04-21 LAB — CBC WITH DIFFERENTIAL/PLATELET
BASOS ABS: 0 10*3/uL (ref 0.0–0.1)
BASOS PCT: 0.7 % (ref 0.0–3.0)
EOS ABS: 0 10*3/uL (ref 0.0–0.7)
EOS PCT: 0.9 % (ref 0.0–5.0)
HEMATOCRIT: 48 % (ref 39.0–52.0)
HEMOGLOBIN: 16.4 g/dL (ref 13.0–17.0)
LYMPHS ABS: 1.7 10*3/uL (ref 0.7–4.0)
Lymphocytes Relative: 33.4 % (ref 12.0–46.0)
MCHC: 34.2 g/dL (ref 30.0–36.0)
MCV: 87.9 fl (ref 78.0–100.0)
MONOS PCT: 8.7 % (ref 3.0–12.0)
Monocytes Absolute: 0.4 10*3/uL (ref 0.1–1.0)
NEUTROS ABS: 2.9 10*3/uL (ref 1.4–7.7)
Neutrophils Relative %: 56.3 % (ref 43.0–77.0)
Platelets: 140 10*3/uL — ABNORMAL LOW (ref 150.0–400.0)
RBC: 5.45 Mil/uL (ref 4.22–5.81)
RDW: 13.6 % (ref 11.5–15.5)
WBC: 5.1 10*3/uL (ref 4.0–10.5)

## 2018-04-21 LAB — LIPID PANEL
CHOLESTEROL: 219 mg/dL — AB (ref 0–200)
HDL: 66.6 mg/dL (ref >39.00)
LDL CALC: 131 mg/dL — AB (ref 0–99)
NonHDL: 152.21
Total CHOL/HDL Ratio: 3
Triglycerides: 105 mg/dL (ref 0.0–149.0)
VLDL: 21 mg/dL (ref 0.0–40.0)

## 2018-04-21 LAB — COMPREHENSIVE METABOLIC PANEL
ALBUMIN: 4.7 g/dL (ref 3.5–5.2)
ALT: 13 U/L (ref 0–53)
AST: 18 U/L (ref 0–37)
Alkaline Phosphatase: 45 U/L (ref 39–117)
BILIRUBIN TOTAL: 0.6 mg/dL (ref 0.2–1.2)
BUN: 12 mg/dL (ref 6–23)
CO2: 32 mEq/L (ref 19–32)
CREATININE: 0.95 mg/dL (ref 0.40–1.50)
Calcium: 9.8 mg/dL (ref 8.4–10.5)
Chloride: 100 mEq/L (ref 96–112)
GFR: 91.2 mL/min (ref >60.00)
Glucose, Bld: 91 mg/dL (ref 70–99)
Potassium: 4.1 mEq/L (ref 3.5–5.1)
Sodium: 139 mEq/L (ref 135–145)
TOTAL PROTEIN: 7.1 g/dL (ref 6.0–8.3)

## 2018-04-21 LAB — HEMOGLOBIN A1C: Hgb A1c MFr Bld: 5.3 % (ref 4.6–6.5)

## 2018-04-21 NOTE — Progress Notes (Signed)
I acted as a Education administrator for [employer], PA-C Anselmo Pickler, LPN  Subjective:    Brian Brady is a 33 y.o. male and is here for a comprehensive physical exam.  HPI  There are no preventive care reminders to display for this patient.  Acute Concerns: None  Chronic Issues: Hyperlipidemia -- tries to eat healthy, exercises often. Never on statins.  Health Maintenance: Immunizations -- UTD Colonoscopy -- N/A PSA -- N/A Diet -- well balanced diet Caffeine intake -- 1 cup of coffee Sleep habits -- 5-6 hours, sleep  Exercise -- 3-4 times a week Weight -- Weight: 162 lb (73.5 kg)  Weight history Wt Readings from Last 10 Encounters:  04/21/18 162 lb (73.5 kg)  09/15/17 160 lb (72.6 kg)  04/16/17 169 lb (76.7 kg)  03/22/16 170 lb (77.1 kg)   Mood -- good   Depression screen PHQ 2/9 04/21/2018  Decreased Interest 0  Down, Depressed, Hopeless 0  PHQ - 2 Score 0     Other providers/specialists: Patient Care Team: Inda Coke, [place] as PCP - General (Physician Assistant)  Dermatology for skin checks UTD    PMHx, SurgHx, SocialHx, Medications, and Allergies were reviewed in the Visit Navigator and updated as appropriate.   Past Medical History:  Diagnosis Date  . Atypical mole    followed by dermatology  . Shingles 03/21/2016     Past Surgical History:  Procedure Laterality Date  . FRACTURE SURGERY  2017   Nose  . WISDOM TOOTH EXTRACTION Bilateral 2013     Family History  Problem Relation Age of Onset  . Colon cancer Neg Hx   . Prostate cancer Neg Hx     Social History   Tobacco Use  . Smoking status: Never Smoker  . Smokeless tobacco: Never Used  Substance Use Topics  . Alcohol use: Yes    Comment: 6 drinks a week, no more than 2-3 day  . Drug use: No    Review of Systems:   Review of Systems  Constitutional: Negative.  Negative for chills, fever, malaise/fatigue and weight loss.  HENT: Negative.  Negative for hearing loss, sinus pain and  sore throat.   Eyes: Negative.  Negative for blurred vision.  Respiratory: Negative.  Negative for cough and shortness of breath.   Cardiovascular: Negative.  Negative for chest pain, palpitations and leg swelling.  Gastrointestinal: Negative.  Negative for abdominal pain, constipation, diarrhea, heartburn, nausea and vomiting.  Genitourinary: Negative.  Negative for dysuria, frequency and urgency.  Musculoskeletal: Negative.  Negative for back pain, myalgias and neck pain.  Skin: Negative.  Negative for itching and rash.  Neurological: Negative.  Negative for dizziness, tingling, seizures, loss of consciousness and headaches.  Endo/Heme/Allergies: Negative.  Negative for polydipsia.  Psychiatric/Behavioral: Negative.  Negative for depression. The patient is not nervous/anxious.     Objective:   Vitals:   04/21/18 0924  BP: 120/84  Pulse: 72  Temp: 98 F (36.7 C)  SpO2: 100%   Body mass index is 23.58 kg/m.  General Appearance:  Alert, cooperative, no distress, appears stated age  Head:  Normocephalic, without obvious abnormality, atraumatic  Eyes:  PERRL, conjunctiva/corneas clear, EOM's intact, fundi benign, both eyes       Ears:  Normal TM's and external ear canals, both ears  Nose: Nares normal, septum midline, mucosa normal, no drainage    or sinus tenderness  Throat: Lips, mucosa, and tongue normal; teeth and gums normal  Neck: Supple, symmetrical, trachea midline, no adenopathy; thyroid:  No enlargement/tenderness/nodules; no carotit bruit or JVD  Back:   Symmetric, no curvature, ROM normal, no CVA tenderness  Lungs:   Clear to auscultation bilaterally, respirations unlabored  Chest wall:  No tenderness or deformity  Heart:  Regular rate and rhythm, S1 and S2 normal, no murmur, rub   or gallop  Abdomen:   Soft, non-tender, bowel sounds active all four quadrants, no masses, no organomegaly  Extremities: Extremities normal, atraumatic, no cyanosis or edema  Prostate: Not  done.   Skin: Skin color, texture, turgor normal, no rashes or lesions  Lymph nodes: Cervical, supraclavicular, and axillary nodes normal  Neurologic: CNII-XII grossly intact. Normal strength, sensation and reflexes throughout    Assessment/Plan:   Brian Brady was seen today for annual exam.  Diagnoses and all orders for this visit:  Routine physical examination Today patient counseled on age appropriate routine health concerns for screening and prevention, each reviewed and up to date or declined. Immunizations reviewed and up to date or declined. Labs ordered and reviewed. Risk factors for depression reviewed and negative. Hearing function and visual acuity are intact. ADLs screened and addressed as needed. Functional ability and level of safety reviewed and appropriate. Education, counseling and referrals performed based on assessed risks today. Patient provided with a copy of personalized plan for preventive services. -     CBC with Differential/Platelet -     Comprehensive metabolic panel -     Lipid panel -     Hemoglobin A1c  Hyperlipidemia, unspecified hyperlipidemia type -     Lipid panel   Well Adult Exam: Labs ordered: Yes. Patient counseling was done. See below for items discussed. Discussed the patient's BMI.  The BMI is in the acceptable range Follow up as needed for acute illness.  Patient Counseling: [x]   Nutrition: Stressed importance of moderation in sodium/caffeine intake, saturated fat and cholesterol, caloric balance, sufficient intake of fresh fruits, vegetables, and fiber.  [x]   Stressed the importance of regular exercise.   []   Substance Abuse: Discussed cessation/primary prevention of tobacco, alcohol, or other drug use; driving or other dangerous activities under the influence; availability of treatment for abuse.   [x]   Injury prevention: Discussed safety belts, safety helmets, smoke detector, smoking near bedding or upholstery.   []   Sexuality: Discussed sexually  transmitted diseases, partner selection, use of condoms, avoidance of unintended pregnancy  and contraceptive alternatives.   [x]   Dental health: Discussed importance of regular tooth brushing, flossing, and dental visits.  [x]   Health maintenance and immunizations reviewed. Please refer to Health maintenance section.     Inda Coke, PA-C [place]

## 2018-04-21 NOTE — Patient Instructions (Signed)
It was great to see you!  Please go to the lab for blood work.   Our office will call you with your results unless you have chosen to receive results via MyChart.  If your blood work is normal we will follow-up each year for physicals and as scheduled for chronic medical problems.  If anything is abnormal we will treat accordingly and get you in for a follow-up.  Take care,  [facility] Maintenance, Male A healthy lifestyle and preventive care is important for your health and wellness. Ask your health care provider about what schedule of regular examinations is right for you. What should I know about weight and diet? Eat a Healthy Diet  Eat plenty of vegetables, fruits, whole grains, low-fat dairy products, and lean protein.  Do not eat a lot of foods high in solid fats, added sugars, or salt.  Maintain a Healthy Weight Regular exercise can help you achieve or maintain a healthy weight. You should:  Do at least 150 minutes of exercise each week. The exercise should increase your heart rate and make you sweat (moderate-intensity exercise).  Do strength-training exercises at least twice a week. Watch Your Levels of Cholesterol and Blood Lipids  Have your blood tested for lipids and cholesterol every 5 years starting at 33 years of age. If you are at high risk for heart disease, you should start having your blood tested when you are 33 years old. You may need to have your cholesterol levels checked more often if: ? Your lipid or cholesterol levels are high. ? You are older than 33 years of age. ? You are at high risk for heart disease. What should I know about cancer screening? Many types of cancers can be detected early and may often be prevented. Lung Cancer  You should be screened every year for lung cancer if: ? You are a current smoker who has smoked for at least 30 years. ? You are a former smoker who has quit within the past 15 years.  Talk to your health care  provider about your screening options, when you should start screening, and how often you should be screened. Colorectal Cancer  Routine colorectal cancer screening usually begins at 33 years of age and should be repeated every 5-10 years until you are 33 years old. You may need to be screened more often if early forms of precancerous polyps or small growths are found. Your health care provider may recommend screening at an earlier age if you have risk factors for colon cancer.  Your health care provider may recommend using home test kits to check for hidden blood in the stool.  A small camera at the end of a tube can be used to examine your colon (sigmoidoscopy or colonoscopy). This checks for the earliest forms of colorectal cancer. Prostate and Testicular Cancer  Depending on your age and overall health, your health care provider may do certain tests to screen for prostate and testicular cancer.  Talk to your health care provider about any symptoms or concerns you have about testicular or prostate cancer. Skin Cancer  Check your skin from head to toe regularly.  Tell your health care provider about any new moles or changes in moles, especially if: ? There is a change in a mole's size, shape, or color. ? You have a mole that is larger than a pencil eraser.  Always use sunscreen. Apply sunscreen liberally and repeat throughout the day.  Protect yourself by wearing  long sleeves, pants, a wide-brimmed hat, and sunglasses when outside. What should I know about heart disease, diabetes, and high blood pressure?  If you are 11-73 years of age, have your blood pressure checked every 3-5 years. If you are 49 years of age or older, have your blood pressure checked every year. You should have your blood pressure measured twice-once when you are at a hospital or clinic, and once when you are not at a hospital or clinic. Record the average of the two measurements. To check your blood pressure when you  are not at a hospital or clinic, you can use: ? An automated blood pressure machine at a pharmacy. ? A home blood pressure monitor.  Talk to your health care provider about your target blood pressure.  If you are between 76-68 years old, ask your health care provider if you should take aspirin to prevent heart disease.  Have regular diabetes screenings by checking your fasting blood sugar level. ? If you are at a normal weight and have a low risk for diabetes, have this test once every three years after the age of 11. ? If you are overweight and have a high risk for diabetes, consider being tested at a younger age or more often.  A one-time screening for abdominal aortic aneurysm (AAA) by ultrasound is recommended for men aged 67-75 years who are current or former smokers. What should I know about preventing infection? Hepatitis B If you have a higher risk for hepatitis B, you should be screened for this virus. Talk with your health care provider to find out if you are at risk for hepatitis B infection. Hepatitis C Blood testing is recommended for:  Everyone born from [birth year] through 1965.  Anyone with known risk factors for hepatitis C. Sexually Transmitted Diseases (STDs)  You should be screened each year for STDs including gonorrhea and chlamydia if: ? You are sexually active and are younger than 33 years of age. ? You are older than 33 years of age and your health care provider tells you that you are at risk for this type of infection. ? Your sexual activity has changed since you were last screened and you are at an increased risk for chlamydia or gonorrhea. Ask your health care provider if you are at risk.  Talk with your health care provider about whether you are at high risk of being infected with HIV. Your health care provider may recommend a prescription medicine to help prevent HIV infection. What else can I do?  Schedule regular health, dental, and eye exams.  Stay current  with your vaccines (immunizations).  Do not use any tobacco products, such as cigarettes, chewing tobacco, and e-cigarettes. If you need help quitting, ask your health care provider.  Limit alcohol intake to no more than 2 drinks per day. One drink equals 12 ounces of beer, 5 ounces of wine, or 1 ounces of hard liquor.  Do not use street drugs.  Do not share needles.  Ask your health care provider for help if you need support or information about quitting drugs.  Tell your health care provider if you often feel depressed.  Tell your health care provider if you have ever been abused or do not feel safe at home. This information is not intended to replace advice given to you by your health care provider. Make sure you discuss any questions you have with your health care provider. Document Released: 07/21/2007 Document Revised: 09/21/2015 Document Reviewed: 10/26/2014 Elsevier Interactive  Patient Education  2019 [employer].

## 2018-04-22 ENCOUNTER — Telehealth: Payer: Self-pay | Admitting: *Deleted

## 2018-04-22 NOTE — Telephone Encounter (Signed)
See note

## 2018-04-22 NOTE — Telephone Encounter (Signed)
Left detailed message on personal voicemail, calling to let you know I faxed your Health form to [facility] this morning. I wanted to know if you want me to mail the original form or come by and pick it up?

## 2018-04-22 NOTE — Telephone Encounter (Signed)
Patient returning call, he states he would like it mailed to address in chart.

## 2018-04-23 ENCOUNTER — Ambulatory Visit: Payer: Self-pay | Admitting: *Deleted

## 2018-04-23 NOTE — Telephone Encounter (Signed)
Mailed form to pt

## 2018-04-23 NOTE — Telephone Encounter (Signed)
Pt calling with complaints of having a fever of 100.9 that was checked an hour before calling the office. Pt states he also has a cough since Tuesday, sore throat and lethargy. Pt had concerns of COVID-19, but pt states he has not traveled to any of the High Risk Exposure areas or been around anyone who was confirmed to have COVID-19. Pt states he did travel to [place] with family recently. Advised pt what at this time he would be considered low risk  For COVID-19. Pt given home care advice for current symptoms and advised to seek treatment in the ED for worsening symptoms. Pt verbalized understanding.   Reason for Disposition . [1] Fever AND [2] no signs of serious infection or localizing symptoms (all other triage questions negative) . [1] Sore throat with cough/cold symptoms AND [2] present < 5 days  Answer Assessment - Initial Assessment Questions 1. ONSET: "When did the throat start hurting?" (Hours or days ago)      Monday evening 2.  VIRAL SYMPTOMS: "Are there any symptoms of a cold, such as a runny nose, cough, hoarse voice or red eyes?"      cough 3. FEVER: "Do you have a fever?" If so, ask: "What is your temperature, how was it measured, and when did it start?"     100.9 4. OTHER SYMPTOMS: "Do you have any other symptoms?" (e.g., difficulty breathing, headache, rash)     Cough, lethargy  Answer Assessment - Initial Assessment Questions 1. TEMPERATURE: "What is the most recent temperature?"  "How was it measured?"      100.9 oral 2. ONSET: "When did the fever start?"      Believes it began last night due to sweating during the night but states he did not check his temp 3. SYMPTOMS: "Do you have any other symptoms besides the fever?"  (e.g., colds, headache, sore throat, earache, cough, rash, diarrhea, vomiting, abdominal pain)     Sore throat, cough 4. TRAVEL: "Have you traveled out of the country in the last month?" (e.g., travel history, exposures)    No and no sick  contacts  Protocols used: FEVER-A-AH, SORE THROAT-A-AH

## 2019-04-23 ENCOUNTER — Encounter: Payer: 59 | Admitting: Physician Assistant

## 2019-04-23 NOTE — Progress Notes (Signed)
I acted as a Education administrator for [employer], PA-C Anselmo Pickler, LPN  Subjective:    Brian Brady is a 34 y.o. male and is here for a comprehensive physical exam.  HPI  There are no preventive care reminders to display for this patient.  Acute Concerns: None  Chronic Issues: Hyperlipidemia --patient has never been on statins, would like his lipid profile updated today.  Health Maintenance: Immunizations -- UTD Colonoscopy -- N/A PSA -- N/A Diet --overall well-balanced Sleep habits --denies any concerns Exercise -- working out at home during the pandemic Weight -- Weight: 168 lb (76.2 kg)  Weight history Wt Readings from Last 10 Encounters:  04/24/19 168 lb (76.2 kg)  04/21/18 162 lb (73.5 kg)  09/15/17 160 lb (72.6 kg)  04/16/17 169 lb (76.7 kg)  03/22/16 170 lb (77.1 kg)   Mood --no concerns Tobacco use --none Alcohol use --- 6 drinks a week, no more than 2 to 3/day, denies concerns  Depression screen [facility] 2/9 04/24/2019  Decreased Interest 0  Down, Depressed, Hopeless 0  PHQ - 2 Score 0     Other providers/specialists: Patient Care Team: Inda Coke, [place] as PCP - General (Physician Assistant)   PMHx, SurgHx, SocialHx, Medications, and Allergies were reviewed in the Visit Navigator and updated as appropriate.   Past Medical History:  Diagnosis Date  . Atypical mole    followed by dermatology  . Shingles 03/21/2016     Past Surgical History:  Procedure Laterality Date  . FRACTURE SURGERY  2017   Nose  . WISDOM TOOTH EXTRACTION Bilateral 2013     Family History  Problem Relation Age of Onset  . Colon cancer Neg Hx   . Prostate cancer Neg Hx     Social History   Tobacco Use  . Smoking status: Never Smoker  . Smokeless tobacco: Never Used  Substance Use Topics  . Alcohol use: Yes    Comment: 6 drinks a week, no more than 2-3 day  . Drug use: No    Review of Systems:   Review of Systems  Constitutional: Negative for chills, fever,  malaise/fatigue and weight loss.  HENT: Negative for hearing loss, sinus pain and sore throat.   Eyes: Negative for blurred vision.  Respiratory: Negative for cough and shortness of breath.   Cardiovascular: Negative for chest pain, palpitations and leg swelling.  Gastrointestinal: Negative for abdominal pain, constipation, diarrhea, heartburn, nausea and vomiting.  Genitourinary: Negative for dysuria, frequency and urgency.  Musculoskeletal: Negative for back pain, myalgias and neck pain.  Skin: Negative for itching and rash.  Neurological: Negative for dizziness, tingling, seizures, loss of consciousness and headaches.  Endo/Heme/Allergies: Negative for polydipsia.  Psychiatric/Behavioral: Negative for depression. The patient is not nervous/anxious.   All other systems reviewed and are negative.   Objective:   Vitals:   04/24/19 0835  BP: 124/80  Pulse: 62  Temp: (!) 97.3 F (36.3 C)  SpO2: 98%   Body mass index is 24.45 kg/m.  General Appearance:  Alert, cooperative, no distress, appears stated age  Head:  Normocephalic, without obvious abnormality, atraumatic  Eyes:  PERRL, conjunctiva/corneas clear, EOM's intact, fundi benign, both eyes       Ears:  Normal TM's and external ear canals, both ears  Nose: Nares normal, septum midline, mucosa normal, no drainage    or sinus tenderness  Throat: Lips, mucosa, and tongue normal; teeth and gums normal  Neck: Supple, symmetrical, trachea midline, no adenopathy; thyroid:  No enlargement/tenderness/nodules; no carotit  bruit or JVD  Back:   Symmetric, no curvature, ROM normal, no CVA tenderness  Lungs:   Clear to auscultation bilaterally, respirations unlabored  Chest wall:  No tenderness or deformity  Heart:  Regular rate and rhythm, S1 and S2 normal, no murmur, rub   or gallop  Abdomen:   Soft, non-tender, bowel sounds active all four quadrants, no masses, no organomegaly  Extremities: Extremities normal, atraumatic, no cyanosis or  edema  Prostate: Not done.   Skin: Skin color, texture, turgor normal, no rashes or lesions  Lymph nodes: Cervical, supraclavicular, and axillary nodes normal  Neurologic: CNII-XII grossly intact. Normal strength, sensation and reflexes throughout    Assessment/Plan:   Ramond was seen today for annual exam.  Diagnoses and all orders for this visit:  Routine physical examination Today patient counseled on age appropriate routine health concerns for screening and prevention, each reviewed and up to date or declined. Immunizations reviewed and up to date or declined. Labs ordered and reviewed. Risk factors for depression reviewed and negative. Hearing function and visual acuity are intact. ADLs screened and addressed as needed. Functional ability and level of safety reviewed and appropriate. Education, counseling and referrals performed based on assessed risks today. Patient provided with a copy of personalized plan for preventive services. -     CBC with Differential/Platelet -     Comprehensive metabolic panel  Hyperlipidemia, unspecified hyperlipidemia type -     Lipid panel  Well Adult Exam: Labs ordered: Yes. Patient counseling was done. See below for items discussed. Discussed the patient's BMI.  The BMI is in the acceptable range Follow up as needed for acute illness.  Patient Counseling: [x]   Nutrition: Stressed importance of moderation in sodium/caffeine intake, saturated fat and cholesterol, caloric balance, sufficient intake of fresh fruits, vegetables, and fiber.  [x]   Stressed the importance of regular exercise.   []   Substance Abuse: Discussed cessation/primary prevention of tobacco, alcohol, or other drug use; driving or other dangerous activities under the influence; availability of treatment for abuse.   [x]   Injury prevention: Discussed safety belts, safety helmets, smoke detector, smoking near bedding or upholstery.   []   Sexuality: Discussed sexually transmitted diseases,  partner selection, use of condoms, avoidance of unintended pregnancy  and contraceptive alternatives.   [x]   Dental health: Discussed importance of regular tooth brushing, flossing, and dental visits.  [x]   Health maintenance and immunizations reviewed. Please refer to Health maintenance section.    CMA or LPN served as scribe during this visit. History, Physical, and Plan performed by medical provider. The above documentation has been reviewed and is accurate and complete.   Inda Coke, PA-C [place]

## 2019-04-24 ENCOUNTER — Encounter: Payer: Self-pay | Admitting: Physician Assistant

## 2019-04-24 ENCOUNTER — Ambulatory Visit (INDEPENDENT_AMBULATORY_CARE_PROVIDER_SITE_OTHER): Payer: 59 | Admitting: Physician Assistant

## 2019-04-24 VITALS — BP 124/80 | HR 62 | Temp 97.3°F | Ht 69.5 in | Wt 168.0 lb

## 2019-04-24 DIAGNOSIS — E785 Hyperlipidemia, unspecified: Secondary | ICD-10-CM | POA: Diagnosis not present

## 2019-04-24 DIAGNOSIS — Z0000 Encounter for general adult medical examination without abnormal findings: Secondary | ICD-10-CM

## 2019-04-24 LAB — CBC WITH DIFFERENTIAL/PLATELET
Basophils Absolute: 0.1 10*3/uL (ref 0.0–0.1)
Basophils Relative: 1 % (ref 0.0–3.0)
Eosinophils Absolute: 0 10*3/uL (ref 0.0–0.7)
Eosinophils Relative: 0.8 % (ref 0.0–5.0)
HCT: 44.4 % (ref 39.0–52.0)
Hemoglobin: 15.5 g/dL (ref 13.0–17.0)
Lymphocytes Relative: 30.1 % (ref 12.0–46.0)
Lymphs Abs: 1.8 10*3/uL (ref 0.7–4.0)
MCHC: 34.9 g/dL (ref 30.0–36.0)
MCV: 87.2 fl (ref 78.0–100.0)
Monocytes Absolute: 0.6 10*3/uL (ref 0.1–1.0)
Monocytes Relative: 10.5 % (ref 3.0–12.0)
Neutro Abs: 3.4 10*3/uL (ref 1.4–7.7)
Neutrophils Relative %: 57.6 % (ref 43.0–77.0)
Platelets: 168 10*3/uL (ref 150.0–400.0)
RBC: 5.09 Mil/uL (ref 4.22–5.81)
RDW: 13.1 % (ref 11.5–15.5)
WBC: 6 10*3/uL (ref 4.0–10.5)

## 2019-04-24 LAB — COMPREHENSIVE METABOLIC PANEL
ALT: 13 U/L (ref 0–53)
AST: 16 U/L (ref 0–37)
Albumin: 4.4 g/dL (ref 3.5–5.2)
Alkaline Phosphatase: 43 U/L (ref 39–117)
BUN: 13 mg/dL (ref 6–23)
CO2: 32 mEq/L (ref 19–32)
Calcium: 9.5 mg/dL (ref 8.4–10.5)
Chloride: 101 mEq/L (ref 96–112)
Creatinine, Ser: 0.95 mg/dL (ref 0.40–1.50)
GFR: 90.65 mL/min (ref >60.00)
Glucose, Bld: 87 mg/dL (ref 70–99)
Potassium: 4.1 mEq/L (ref 3.5–5.1)
Sodium: 137 mEq/L (ref 135–145)
Total Bilirubin: 0.6 mg/dL (ref 0.2–1.2)
Total Protein: 7 g/dL (ref 6.0–8.3)

## 2019-04-24 LAB — LIPID PANEL
Cholesterol: 202 mg/dL — ABNORMAL HIGH (ref 0–200)
HDL: 56.4 mg/dL (ref >39.00)
LDL Cholesterol: 124 mg/dL — ABNORMAL HIGH (ref 0–99)
NonHDL: 145.18
Total CHOL/HDL Ratio: 4
Triglycerides: 107 mg/dL (ref 0.0–149.0)
VLDL: 21.4 mg/dL (ref 0.0–40.0)

## 2019-04-24 NOTE — Patient Instructions (Signed)

## 2020-03-11 ENCOUNTER — Other Ambulatory Visit: Payer: Self-pay

## 2020-03-11 ENCOUNTER — Encounter: Payer: Self-pay | Admitting: Physician Assistant

## 2020-03-11 ENCOUNTER — Ambulatory Visit (INDEPENDENT_AMBULATORY_CARE_PROVIDER_SITE_OTHER): Payer: 59 | Admitting: Physician Assistant

## 2020-03-11 VITALS — BP 122/78 | HR 72 | Temp 97.9°F | Ht 69.5 in | Wt 169.0 lb

## 2020-03-11 DIAGNOSIS — H9202 Otalgia, left ear: Secondary | ICD-10-CM

## 2020-03-11 NOTE — Progress Notes (Signed)
Brian Brady is a 35 y.o. male here for a new problem.  History of Present Illness:   Chief Complaint  Patient presents with  . Ear Pain    Covid + on Jan 10th, ear pain started Mon/Tue in left ear and has got worse and moved down into neck    HPI   Ear pain He had COVID in January -- mostly sinusitis type symptoms that resolved. He felt like he fully recovered. His family recently all caught COVID, however he tested negative this morning. He started having ear pain Mon/Tue of this week. L ear. Getting worse with time, and bothered him last night. Has not tried anything for his symptoms.  Denies: fevers, chills, sore throat, runny nose, recent past/upcoming travel  Past Medical History:  Diagnosis Date  . Atypical mole    followed by dermatology  . Shingles 03/21/2016     Social History   Tobacco Use  . Smoking status: Never Smoker  . Smokeless tobacco: Never Used  Substance Use Topics  . Alcohol use: Yes    Comment: 6 drinks a week, no more than 2-3 day  . Drug use: No    Past Surgical History:  Procedure Laterality Date  . FRACTURE SURGERY  2017   Nose  . WISDOM TOOTH EXTRACTION Bilateral 2013    Family History  Problem Relation Age of Onset  . Colon cancer Neg Hx   . Prostate cancer Neg Hx     Allergies  Allergen Reactions  . Amoxicillin     When a baby    Current Medications:   Current Outpatient Medications:  Marland Kitchen  Multiple Vitamin (MULTIVITAMIN) tablet, Take 1 tablet by mouth daily., Disp: , Rfl:  .  Omega-3 Fatty Acids (FISH OIL PO), Take by mouth., Disp: , Rfl:    Review of Systems:   ROS Negative unless otherwise specified per HPI.  Vitals:   Vitals:   03/11/20 1034  BP: 122/78  Pulse: 72  Temp: 97.9 F (36.6 C)  TempSrc: Temporal  SpO2: 98%  Weight: 169 lb (76.7 kg)  Height: 5' 9.5" (1.765 m)     Body mass index is 24.6 kg/m.  Physical Exam:   Physical Exam Vitals and nursing note reviewed.  Constitutional:      General: He is  not in acute distress.    Appearance: He is well-developed. He is not ill-appearing, toxic-appearing or sickly-appearing.  HENT:     Head: Normocephalic and atraumatic.     Right Ear: Tympanic membrane, ear canal and external ear normal. Tympanic membrane is not erythematous, retracted or bulging.     Left Ear: Ear canal and external ear normal. A middle ear effusion is present. Tympanic membrane is not erythematous, retracted or bulging.     Nose: Nose normal.     Right Sinus: No maxillary sinus tenderness or frontal sinus tenderness.     Left Sinus: No maxillary sinus tenderness or frontal sinus tenderness.     Mouth/Throat:     Pharynx: Uvula midline. No posterior oropharyngeal edema or posterior oropharyngeal erythema.  Eyes:     General: Lids are normal.     Conjunctiva/sclera: Conjunctivae normal.  Neck:     Trachea: Trachea normal.  Cardiovascular:     Rate and Rhythm: Normal rate and regular rhythm.     Pulses: Normal pulses.     Heart sounds: Normal heart sounds, S1 normal and S2 normal.     Comments: No LE edema Pulmonary:  Effort: Pulmonary effort is normal.     Breath sounds: Normal breath sounds. No decreased breath sounds, wheezing, rhonchi or rales.  Lymphadenopathy:     Cervical: Cervical adenopathy present.     Left cervical: Superficial cervical adenopathy present.  Skin:    General: Skin is warm, dry and intact.  Neurological:     Mental Status: He is alert.     GCS: GCS eye subscore is 4. GCS verbal subscore is 5. GCS motor subscore is 6.  Psychiatric:        Mood and Affect: Mood and affect normal.        Speech: Speech normal.        Behavior: Behavior normal. Behavior is cooperative.     Assessment and Plan:   Philbert was seen today for ear pain.  Diagnoses and all orders for this visit:  Left ear pain   No red flags or evidence of infection on exam. Suspect ETD. Start oral ibuprofen and flonase BID. Follow-up via MyChart if no improvement or  any worsening.   Inda Coke, PA-C

## 2020-05-11 ENCOUNTER — Encounter: Payer: 59 | Admitting: Physician Assistant

## 2020-05-17 ENCOUNTER — Encounter: Payer: Self-pay | Admitting: Physician Assistant

## 2020-05-17 ENCOUNTER — Other Ambulatory Visit: Payer: Self-pay

## 2020-05-17 ENCOUNTER — Ambulatory Visit (INDEPENDENT_AMBULATORY_CARE_PROVIDER_SITE_OTHER): Payer: 59 | Admitting: Physician Assistant

## 2020-05-17 VITALS — BP 112/80 | HR 62 | Temp 97.2°F | Ht 69.0 in | Wt 166.0 lb

## 2020-05-17 DIAGNOSIS — E785 Hyperlipidemia, unspecified: Secondary | ICD-10-CM

## 2020-05-17 DIAGNOSIS — Z1159 Encounter for screening for other viral diseases: Secondary | ICD-10-CM | POA: Diagnosis not present

## 2020-05-17 DIAGNOSIS — Z0000 Encounter for general adult medical examination without abnormal findings: Secondary | ICD-10-CM

## 2020-05-17 LAB — CBC WITH DIFFERENTIAL/PLATELET
Basophils Absolute: 0.1 10*3/uL (ref 0.0–0.1)
Basophils Relative: 0.7 % (ref 0.0–3.0)
Eosinophils Absolute: 0 10*3/uL (ref 0.0–0.7)
Eosinophils Relative: 0.6 % (ref 0.0–5.0)
HCT: 42.6 % (ref 39.0–52.0)
Hemoglobin: 14.2 g/dL (ref 13.0–17.0)
Lymphocytes Relative: 21.9 % (ref 12.0–46.0)
Lymphs Abs: 1.7 10*3/uL (ref 0.7–4.0)
MCHC: 33.3 g/dL (ref 30.0–36.0)
MCV: 81.9 fl (ref 78.0–100.0)
Monocytes Absolute: 0.7 10*3/uL (ref 0.1–1.0)
Monocytes Relative: 9.2 % (ref 3.0–12.0)
Neutro Abs: 5.2 10*3/uL (ref 1.4–7.7)
Neutrophils Relative %: 67.6 % (ref 43.0–77.0)
Platelets: 195 10*3/uL (ref 150.0–400.0)
RBC: 5.2 Mil/uL (ref 4.22–5.81)
RDW: 14 % (ref 11.5–15.5)
WBC: 7.7 10*3/uL (ref 4.0–10.5)

## 2020-05-17 LAB — COMPREHENSIVE METABOLIC PANEL
ALT: 16 U/L (ref 0–53)
AST: 21 U/L (ref 0–37)
Albumin: 4.3 g/dL (ref 3.5–5.2)
Alkaline Phosphatase: 42 U/L (ref 39–117)
BUN: 10 mg/dL (ref 6–23)
CO2: 30 mEq/L (ref 19–32)
Calcium: 9.8 mg/dL (ref 8.4–10.5)
Chloride: 102 mEq/L (ref 96–112)
Creatinine, Ser: 0.96 mg/dL (ref 0.40–1.50)
GFR: 102.58 mL/min (ref >60.00)
Glucose, Bld: 79 mg/dL (ref 70–99)
Potassium: 5 mEq/L (ref 3.5–5.1)
Sodium: 139 mEq/L (ref 135–145)
Total Bilirubin: 0.6 mg/dL (ref 0.2–1.2)
Total Protein: 7 g/dL (ref 6.0–8.3)

## 2020-05-17 LAB — LIPID PANEL
Cholesterol: 175 mg/dL (ref 0–200)
HDL: 55.8 mg/dL (ref >39.00)
LDL Cholesterol: 103 mg/dL — ABNORMAL HIGH (ref 0–99)
NonHDL: 118.95
Total CHOL/HDL Ratio: 3
Triglycerides: 80 mg/dL (ref 0.0–149.0)
VLDL: 16 mg/dL (ref 0.0–40.0)

## 2020-05-17 NOTE — Patient Instructions (Addendum)

## 2020-05-17 NOTE — Progress Notes (Signed)
I acted as a Education administrator for [employer], PA-C Anselmo Pickler, LPN   Subjective:    Brian Brady is a 35 y.o. male and is here for a comprehensive physical exam.  HPI  Health Maintenance Due  Topic Date Due  . Hepatitis C Screening  Never done    Acute Concerns:   Chronic Issues: HLD --   Health Maintenance: Immunizations -- UTD Colonoscopy -- N/A PSA -- N/A Diet -- maintain healthy diet Caffeine intake -- 2 cups of coffee daily Sleep habits -- no concerns Exercise -- exercise during the week as much as schedule allows Weight -- Weight: 166 lb (75.3 kg)  Weight history Wt Readings from Last 10 Encounters:  05/17/20 166 lb (75.3 kg)  03/11/20 169 lb (76.7 kg)  04/24/19 168 lb (76.2 kg)  04/21/18 162 lb (73.5 kg)  09/15/17 160 lb (72.6 kg)  04/16/17 169 lb (76.7 kg)  03/22/16 170 lb (77.1 kg)   Body mass index is 24.51 kg/m. Mood -- no concerns Tobacco use --  Tobacco Use: Low Risk   . Smoking Tobacco Use: Never Smoker  . Smokeless Tobacco Use: Never Used    Alcohol use ---  reports current alcohol use.   Depression screen PHQ 2/9 05/17/2020  Decreased Interest 0  Down, Depressed, Hopeless 0  PHQ - 2 Score 0     Other providers/specialists: Patient Care Team: Inda Coke, [place] as PCP - General (Physician Assistant)   PMHx, SurgHx, SocialHx, Medications, and Allergies were reviewed in the Visit Navigator and updated as appropriate.   Past Medical History:  Diagnosis Date  . Atypical mole    followed by dermatology  . Shingles 03/21/2016     Past Surgical History:  Procedure Laterality Date  . FRACTURE SURGERY  2017   Nose  . WISDOM TOOTH EXTRACTION Bilateral 2013     Family History  Problem Relation Age of Onset  . Colon cancer Neg Hx   . Prostate cancer Neg Hx     Social History   Tobacco Use  . Smoking status: Never Smoker  . Smokeless tobacco: Never Used  Substance Use Topics  . Alcohol use: Yes    Comment: 6 drinks a week,  no more than 2-3 day  . Drug use: No    Review of Systems:   Review of Systems  Constitutional: Negative for chills, fever, malaise/fatigue and weight loss.  HENT: Negative for hearing loss, sinus pain and sore throat.   Eyes: Negative for blurred vision.  Respiratory: Negative for cough and shortness of breath.   Cardiovascular: Negative for chest pain, palpitations and leg swelling.  Gastrointestinal: Negative for abdominal pain, constipation, diarrhea, heartburn, nausea and vomiting.  Genitourinary: Negative for dysuria, frequency and urgency.  Musculoskeletal: Negative for back pain, myalgias and neck pain.  Skin: Negative for itching and rash.  Neurological: Negative for dizziness, tingling, seizures, loss of consciousness and headaches.  Endo/Heme/Allergies: Negative for polydipsia.  Psychiatric/Behavioral: Negative for depression. The patient is not nervous/anxious.   All other systems reviewed and are negative.   Objective:   Vitals:   05/17/20 0901  BP: 112/80  Pulse: 62  Temp: (!) 97.2 F (36.2 C)  SpO2: 98%   Body mass index is 24.51 kg/m.  General Appearance:  Alert, cooperative, no distress, appears stated age  Head:  Normocephalic, without obvious abnormality, atraumatic  Eyes:  PERRL, conjunctiva/corneas clear, EOM's intact, fundi benign, both eyes       Ears:  Normal TM's and external ear  canals, both ears  Nose: Nares normal, septum midline, mucosa normal, no drainage    or sinus tenderness  Throat: Lips, mucosa, and tongue normal; teeth and gums normal  Neck: Supple, symmetrical, trachea midline, no adenopathy; thyroid:  No enlargement/tenderness/nodules; no carotit bruit or JVD  Back:   Symmetric, no curvature, ROM normal, no CVA tenderness  Lungs:   Clear to auscultation bilaterally, respirations unlabored  Chest wall:  No tenderness or deformity  Heart:  Regular rate and rhythm, S1 and S2 normal, no murmur, rub   or gallop  Abdomen:   Soft,  non-tender, bowel sounds active all four quadrants, no masses, no organomegaly  Extremities: Extremities normal, atraumatic, no cyanosis or edema  Prostate: Not done.   Skin: Skin color, texture, turgor normal, no rashes or lesions  Lymph nodes: Cervical, supraclavicular, and axillary nodes normal  Neurologic: CNII-XII grossly intact. Normal strength, sensation and reflexes throughout     Assessment/Plan:   Brian Brady was seen today for annual exam.  Diagnoses and all orders for this visit:  Routine physical examination Today patient counseled on age appropriate routine health concerns for screening and prevention, each reviewed and up to date or declined. Immunizations reviewed and up to date or declined. Labs ordered and reviewed. Risk factors for depression reviewed and negative. Hearing function and visual acuity are intact. ADLs screened and addressed as needed. Functional ability and level of safety reviewed and appropriate. Education, counseling and referrals performed based on assessed risks today. Patient provided with a copy of personalized plan for preventive services.  Hyperlipidemia, unspecified hyperlipidemia type Update lipid panel and provide recommendations accordingly. -     CBC with Differential/Platelet -     Comprehensive metabolic panel -     Lipid panel  Encounter for screening for other viral diseases -     Hepatitis C antibody   Well Adult Exam: Labs ordered: Yes. Patient counseling was done. See below for items discussed. Discussed the patient's BMI.  The BMI is in the acceptable range Follow up in one year.  Patient Counseling: [x]   Nutrition: Stressed importance of moderation in sodium/caffeine intake, saturated fat and cholesterol, caloric balance, sufficient intake of fresh fruits, vegetables, and fiber.  [x]   Stressed the importance of regular exercise.   []   Substance Abuse: Discussed cessation/primary prevention of tobacco, alcohol, or other drug use;  driving or other dangerous activities under the influence; availability of treatment for abuse.   [x]   Injury prevention: Discussed safety belts, safety helmets, smoke detector, smoking near bedding or upholstery.   []   Sexuality: Discussed sexually transmitted diseases, partner selection, use of condoms, avoidance of unintended pregnancy  and contraceptive alternatives.   [x]   Dental health: Discussed importance of regular tooth brushing, flossing, and dental visits.  [x]   Health maintenance and immunizations reviewed. Please refer to Health maintenance section.    CMA or LPN served as scribe during this visit. History, Physical, and Plan performed by medical provider. The above documentation has been reviewed and is accurate and complete.  Inda Coke, PA-C [place]

## 2020-05-18 LAB — HEPATITIS C ANTIBODY
Hepatitis C Ab: NONREACTIVE
SIGNAL TO CUT-OFF: 0 (ref <1.00)

## 2020-06-02 ENCOUNTER — Encounter: Payer: Self-pay | Admitting: Physician Assistant

## 2021-05-22 NOTE — Progress Notes (Signed)
? ? ?Subjective:  ?  ?Brian Brady is a 36 y.o. male and is here for a comprehensive physical exam. ? ?HPI ? ?There are no preventive care reminders to display for this patient. ? ?Acute Concerns: ?None reported. ? ?Chronic Issues: ?HLD  ?Over the past 3 years, pt was found to have elevated cholesterol. Despite this pt was never prescribed medication due to wanting to adjust his lifestyle to improve numbers. According to past labs in 2022, his numbers have improved gradually. He is interested to re-check these levels today. Denies CP or SOB.  ? ?Health Maintenance: ?Immunizations -- Covid-UTD;2021 ?Influenza- Due;2021 ?Tdap- Due; 06/2021 ?Colonoscopy -- N/A ?PSA -- No results found for: PSA1, PSA ?Dentistry- UTD ?Ophthalmology- As needed; Not regularly ?Diet -- Eats all food groups  ?Sleep habits -- No concerns ?Exercise -- Tries to walk regularly  ?Weight -- Stable ?Weight history ?Wt Readings from Last 10 Encounters:  ?05/23/21 167 lb 3.2 oz (75.8 kg)  ?05/17/20 166 lb (75.3 kg)  ?03/11/20 169 lb (76.7 kg)  ?04/24/19 168 lb (76.2 kg)  ?04/21/18 162 lb (73.5 kg)  ?09/15/17 160 lb (72.6 kg)  ?04/16/17 169 lb (76.7 kg)  ?03/22/16 170 lb (77.1 kg)  ? ?Body mass index is 24.69 kg/m?. ?Mood -- Stable ?Tobacco use --  ?Tobacco Use: Low Risk   ? Smoking Tobacco Use: Never  ? Smokeless Tobacco Use: Never  ? Passive Exposure: Not on file  ?  ?Alcohol use ---  reports current alcohol use.  ? ? ?  05/23/2021  ?  8:06 AM  ?Depression screen PHQ 2/9  ?Decreased Interest 0  ?Down, Depressed, Hopeless 0  ?PHQ - 2 Score 0  ? ? ? ?Other providers/specialists: ?Patient Care Team: ?Inda Coke, PA as PCP - General (Physician Assistant) ? ? ?PMHx, SurgHx, SocialHx, Medications, and Allergies were reviewed in the Visit Navigator and updated as appropriate.  ? ?Past Medical History:  ?Diagnosis Date  ? Atypical mole   ? followed by dermatology  ? Shingles 03/21/2016  ? ? ? ?Past Surgical History:  ?Procedure Laterality Date  ?  FRACTURE SURGERY  2017  ? Nose  ? WISDOM TOOTH EXTRACTION Bilateral 2013  ? ? ? ?Family History  ?Problem Relation Age of Onset  ? Alcohol abuse Paternal Uncle   ? Depression Paternal Aunt   ? Colon cancer Neg Hx   ? Prostate cancer Neg Hx   ? ? ?Social History  ? ?Tobacco Use  ? Smoking status: Never  ? Smokeless tobacco: Never  ?Substance Use Topics  ? Alcohol use: Yes  ?  Comment: 6-8 drinks per week  ? Drug use: No  ? ? ?Review of Systems:  ? ?Review of Systems  ?Constitutional:  Negative for chills, fever, malaise/fatigue and weight loss.  ?HENT:  Negative for hearing loss, sinus pain and sore throat.   ?Respiratory:  Negative for cough and hemoptysis.   ?Cardiovascular:  Negative for chest pain, palpitations, leg swelling and PND.  ?Gastrointestinal:  Negative for abdominal pain, constipation, diarrhea, heartburn, nausea and vomiting.  ?Genitourinary:  Negative for dysuria, frequency and urgency.  ?Musculoskeletal:  Negative for back pain, myalgias and neck pain.  ?Skin:  Negative for itching and rash.  ?Neurological:  Negative for dizziness, tingling, seizures and headaches.  ?Endo/Heme/Allergies:  Negative for polydipsia.  ?Psychiatric/Behavioral:  Negative for depression. The patient is not nervous/anxious.   ? ?Objective:  ? ?Vitals:  ? 05/23/21 0803  ?BP: 120/74  ?Pulse: 64  ?Temp: (!) 97.5 ?F (  36.4 ?C)  ?SpO2: 99%  ? ?Body mass index is 24.69 kg/m?. ? ?General Appearance:  Alert, cooperative, no distress, appears stated age  ?Head:  Normocephalic, without obvious abnormality, atraumatic  ?Eyes:  PERRL, conjunctiva/corneas clear, EOM's intact, fundi benign, both eyes       ?Ears:  Normal TM's and external ear canals, both ears  ?Nose: Nares normal, septum midline, mucosa normal, no drainage    or sinus tenderness  ?Throat: Lips, mucosa, and tongue normal; teeth and gums normal  ?Neck: Supple, symmetrical, trachea midline, no adenopathy; thyroid:  No enlargement/tenderness/nodules; no carotit bruit or JVD   ?Back:   Symmetric, no curvature, ROM normal, no CVA tenderness  ?Lungs:   Clear to auscultation bilaterally, respirations unlabored  ?Chest wall:  No tenderness or deformity  ?Heart:  Regular rate and rhythm, S1 and S2 normal, no murmur, rub   or gallop  ?Abdomen:   Soft, non-tender, bowel sounds active all four quadrants, no masses, no organomegaly  ?Extremities: Extremities normal, atraumatic, no cyanosis or edema  ?Prostate: Not done.   ?Skin: Skin color, texture, turgor normal, no rashes or lesions  ?Lymph nodes: Cervical, supraclavicular, and axillary nodes normal  ?Neurologic: CNII-XII grossly intact. Normal strength, sensation and reflexes throughout  ? ? ?Assessment/Plan:  ? ?Routine physical examination ?Today patient counseled on age appropriate routine health concerns for screening and prevention, each reviewed and up to date or declined. Immunizations reviewed and up to date or declined. Labs ordered and reviewed. Risk factors for depression reviewed and negative. Hearing function and visual acuity are intact. ADLs screened and addressed as needed. Functional ability and level of safety reviewed and appropriate. Education, counseling and referrals performed based on assessed risks today. Patient provided with a copy of personalized plan for preventive services. ? ? ?Hyperlipidemia, unspecified hyperlipidemia type ?Update lipid panel, will start medication as indicated by results  ? ?Patient Counseling: ?'[x]'$   Nutrition: Stressed importance of moderation in sodium/caffeine intake, saturated fat and cholesterol, caloric balance, sufficient intake of fresh fruits, vegetables, and fiber.  ?'[x]'$   Stressed the importance of regular exercise.   ?'[]'$   Substance Abuse: Discussed cessation/primary prevention of tobacco, alcohol, or other drug use; driving or other dangerous activities under the influence; availability of treatment for abuse.   ?'[x]'$   Injury prevention: Discussed safety belts, safety helmets, smoke  detector, smoking near bedding or upholstery.   ?'[]'$   Sexuality: Discussed sexually transmitted diseases, partner selection, use of condoms, avoidance of unintended pregnancy  and contraceptive alternatives.   ?'[x]'$   Dental health: Discussed importance of regular tooth brushing, flossing, and dental visits.  ?'[x]'$   Health maintenance and immunizations reviewed. Please refer to Health maintenance section.  ?  ?I,Havlyn C Ratchford,acting as a scribe for [employer], PA.,have documented all relevant documentation on the behalf of Inda Coke, PA,as directed by  Inda Coke, PA while in the presence of Inda Coke, [place]. ? ?IInda Coke, PA, have reviewed all documentation for this visit. The documentation on 05/23/21 for the exam, diagnosis, procedures, and orders are all accurate and complete. ? ? ?Inda Coke, PA-C ?[place] ? ? ? ? ? ? ? ?

## 2021-05-23 ENCOUNTER — Encounter: Payer: Self-pay | Admitting: Physician Assistant

## 2021-05-23 ENCOUNTER — Other Ambulatory Visit (INDEPENDENT_AMBULATORY_CARE_PROVIDER_SITE_OTHER): Payer: 59

## 2021-05-23 ENCOUNTER — Ambulatory Visit (INDEPENDENT_AMBULATORY_CARE_PROVIDER_SITE_OTHER): Payer: 59 | Admitting: Physician Assistant

## 2021-05-23 VITALS — BP 120/74 | HR 64 | Temp 97.5°F | Ht 69.0 in | Wt 167.2 lb

## 2021-05-23 DIAGNOSIS — E785 Hyperlipidemia, unspecified: Secondary | ICD-10-CM

## 2021-05-23 DIAGNOSIS — Z0000 Encounter for general adult medical examination without abnormal findings: Secondary | ICD-10-CM

## 2021-05-23 LAB — COMPREHENSIVE METABOLIC PANEL
ALT: 12 U/L (ref 0–53)
AST: 17 U/L (ref 0–37)
Albumin: 4.5 g/dL (ref 3.5–5.2)
Alkaline Phosphatase: 36 U/L — ABNORMAL LOW (ref 39–117)
BUN: 15 mg/dL (ref 6–23)
CO2: 29 mEq/L (ref 19–32)
Calcium: 9.3 mg/dL (ref 8.4–10.5)
Chloride: 103 mEq/L (ref 96–112)
Creatinine, Ser: 0.97 mg/dL (ref 0.40–1.50)
GFR: 100.59 mL/min (ref >60.00)
Glucose, Bld: 87 mg/dL (ref 70–99)
Potassium: 4.4 mEq/L (ref 3.5–5.1)
Sodium: 137 mEq/L (ref 135–145)
Total Bilirubin: 0.6 mg/dL (ref 0.2–1.2)
Total Protein: 7.1 g/dL (ref 6.0–8.3)

## 2021-05-23 LAB — CBC WITH DIFFERENTIAL/PLATELET
Basophils Absolute: 0 10*3/uL (ref 0.0–0.1)
Basophils Relative: 1.1 % (ref 0.0–3.0)
Eosinophils Absolute: 0 10*3/uL (ref 0.0–0.7)
Eosinophils Relative: 0.7 % (ref 0.0–5.0)
HCT: 40.8 % (ref 39.0–52.0)
Hemoglobin: 13.5 g/dL (ref 13.0–17.0)
Lymphocytes Relative: 34.9 % (ref 12.0–46.0)
Lymphs Abs: 1.5 10*3/uL (ref 0.7–4.0)
MCHC: 33.1 g/dL (ref 30.0–36.0)
MCV: 77.8 fl — ABNORMAL LOW (ref 78.0–100.0)
Monocytes Absolute: 0.4 10*3/uL (ref 0.1–1.0)
Monocytes Relative: 9.6 % (ref 3.0–12.0)
Neutro Abs: 2.4 10*3/uL (ref 1.4–7.7)
Neutrophils Relative %: 53.7 % (ref 43.0–77.0)
Platelets: 165 10*3/uL (ref 150.0–400.0)
RBC: 5.24 Mil/uL (ref 4.22–5.81)
RDW: 15 % (ref 11.5–15.5)
WBC: 4.4 10*3/uL (ref 4.0–10.5)

## 2021-05-23 LAB — LIPID PANEL
Cholesterol: 207 mg/dL — ABNORMAL HIGH (ref 0–200)
HDL: 66.5 mg/dL (ref >39.00)
LDL Cholesterol: 126 mg/dL — ABNORMAL HIGH (ref 0–99)
NonHDL: 140.9
Total CHOL/HDL Ratio: 3
Triglycerides: 77 mg/dL (ref 0.0–149.0)
VLDL: 15.4 mg/dL (ref 0.0–40.0)

## 2021-05-23 NOTE — Addendum Note (Signed)
Addended by: Erlene Quan on: 05/23/2021 08:43 AM ? ? Modules accepted: Orders ? ?

## 2021-05-23 NOTE — Addendum Note (Signed)
Addended by: Erlene Quan on: 05/23/2021 08:46 AM ? ? Modules accepted: Orders ? ?

## 2021-05-23 NOTE — Patient Instructions (Addendum)
It was great to see you! ? ?Please go get blood work at [facility] can walk in at the [facility] location without a scheduled appointment.  ?The address is [address]. It is across the street from [facility]. ?Hours of operation are M-F 8:30am to 5:00pm. Please note that they are closed for lunch between 12:30 and 1:00pm. ? ?Our office will call you with your results unless you have chosen to receive results via MyChart. ? ?If your blood work is normal we will follow-up each year for physicals and as scheduled for chronic medical problems. ? ?If anything is abnormal we will treat accordingly and get you in for a follow-up. ? ?Take care, ? ?[facility] ?  ?

## 2021-05-24 NOTE — Telephone Encounter (Signed)
Sent information to patient by mail today ?

## 2021-08-29 ENCOUNTER — Encounter: Payer: Self-pay | Admitting: Physician Assistant

## 2021-09-05 ENCOUNTER — Ambulatory Visit (INDEPENDENT_AMBULATORY_CARE_PROVIDER_SITE_OTHER): Payer: 59

## 2021-09-05 DIAGNOSIS — Z23 Encounter for immunization: Secondary | ICD-10-CM | POA: Diagnosis not present

## 2021-10-30 ENCOUNTER — Encounter: Payer: Self-pay | Admitting: *Deleted

## 2021-11-06 ENCOUNTER — Encounter: Payer: Self-pay | Admitting: Physician Assistant

## 2022-05-25 ENCOUNTER — Encounter: Payer: 59 | Admitting: Physician Assistant

## 2022-06-08 ENCOUNTER — Encounter: Payer: 59 | Admitting: Physician Assistant

## 2022-06-12 ENCOUNTER — Encounter: Payer: 59 | Admitting: Physician Assistant

## 2022-06-26 ENCOUNTER — Encounter: Payer: Self-pay | Admitting: Physician Assistant

## 2022-06-26 ENCOUNTER — Ambulatory Visit (INDEPENDENT_AMBULATORY_CARE_PROVIDER_SITE_OTHER): Payer: 59 | Admitting: Physician Assistant

## 2022-06-26 ENCOUNTER — Other Ambulatory Visit: Payer: Self-pay | Admitting: Physician Assistant

## 2022-06-26 VITALS — BP 120/80 | HR 65 | Temp 97.5°F | Ht 69.0 in | Wt 167.5 lb

## 2022-06-26 DIAGNOSIS — Z0000 Encounter for general adult medical examination without abnormal findings: Secondary | ICD-10-CM

## 2022-06-26 DIAGNOSIS — E785 Hyperlipidemia, unspecified: Secondary | ICD-10-CM | POA: Diagnosis not present

## 2022-06-26 DIAGNOSIS — R710 Precipitous drop in hematocrit: Secondary | ICD-10-CM

## 2022-06-26 LAB — CBC WITH DIFFERENTIAL/PLATELET
Basophils Absolute: 0.1 10*3/uL (ref 0.0–0.1)
Basophils Relative: 1.2 % (ref 0.0–3.0)
Eosinophils Absolute: 0 10*3/uL (ref 0.0–0.7)
Eosinophils Relative: 0.6 % (ref 0.0–5.0)
HCT: 37.1 % — ABNORMAL LOW (ref 39.0–52.0)
Hemoglobin: 11.6 g/dL — ABNORMAL LOW (ref 13.0–17.0)
Lymphocytes Relative: 33.1 % (ref 12.0–46.0)
Lymphs Abs: 1.5 10*3/uL (ref 0.7–4.0)
MCHC: 31.3 g/dL (ref 30.0–36.0)
MCV: 72.7 fl — ABNORMAL LOW (ref 78.0–100.0)
Monocytes Absolute: 0.5 10*3/uL (ref 0.1–1.0)
Monocytes Relative: 10.4 % (ref 3.0–12.0)
Neutro Abs: 2.5 10*3/uL (ref 1.4–7.7)
Neutrophils Relative %: 54.7 % (ref 43.0–77.0)
Platelets: 214 10*3/uL (ref 150.0–400.0)
RBC: 5.1 Mil/uL (ref 4.22–5.81)
RDW: 16.8 % — ABNORMAL HIGH (ref 11.5–15.5)
WBC: 4.5 10*3/uL (ref 4.0–10.5)

## 2022-06-26 LAB — COMPREHENSIVE METABOLIC PANEL
ALT: 15 U/L (ref 0–53)
AST: 19 U/L (ref 0–37)
Albumin: 4.3 g/dL (ref 3.5–5.2)
Alkaline Phosphatase: 36 U/L — ABNORMAL LOW (ref 39–117)
BUN: 16 mg/dL (ref 6–23)
CO2: 29 mEq/L (ref 19–32)
Calcium: 9.5 mg/dL (ref 8.4–10.5)
Chloride: 102 mEq/L (ref 96–112)
Creatinine, Ser: 0.97 mg/dL (ref 0.40–1.50)
GFR: 99.83 mL/min (ref >60.00)
Glucose, Bld: 87 mg/dL (ref 70–99)
Potassium: 4.4 mEq/L (ref 3.5–5.1)
Sodium: 138 mEq/L (ref 135–145)
Total Bilirubin: 0.5 mg/dL (ref 0.2–1.2)
Total Protein: 6.7 g/dL (ref 6.0–8.3)

## 2022-06-26 LAB — LIPID PANEL
Cholesterol: 188 mg/dL (ref 0–200)
HDL: 60 mg/dL (ref >39.00)
LDL Cholesterol: 117 mg/dL — ABNORMAL HIGH (ref 0–99)
NonHDL: 128.13
Total CHOL/HDL Ratio: 3
Triglycerides: 56 mg/dL (ref 0.0–149.0)
VLDL: 11.2 mg/dL (ref 0.0–40.0)

## 2022-06-26 NOTE — Progress Notes (Signed)
Subjective:    Brian Brady is a 37 y.o. male and is here for a comprehensive physical exam.  HPI  There are no preventive care reminders to display for this patient.  Acute Concerns: None  Chronic Issues: HLD  Currently not on medications Continues to take fish oil  Health Maintenance: Immunizations -- UTD Colonoscopy -- n/a PSA -- No results found for: "PSA1", "PSA" Diet -- overall well balanced Sleep habits -- overall stable, has had some issues with youngest son require extra care Exercise -- working on getting regular exercise  Weight -- Weight: 167 lb 8 oz (76 kg)  Recent weight history Wt Readings from Last 10 Encounters:  06/26/22 167 lb 8 oz (76 kg)  05/23/21 167 lb 3.2 oz (75.8 kg)  05/17/20 166 lb (75.3 kg)  03/11/20 169 lb (76.7 kg)  04/24/19 168 lb (76.2 kg)  04/21/18 162 lb (73.5 kg)  09/15/17 160 lb (72.6 kg)  04/16/17 169 lb (76.7 kg)  03/22/16 170 lb (77.1 kg)   Body mass index is 24.74 kg/m.  Mood -- stable Alcohol use --  reports current alcohol use.  Tobacco use --  Tobacco Use: Low Risk  (06/26/2022)   Patient History    Smoking Tobacco Use: Never    Smokeless Tobacco Use: Never    Passive Exposure: Not on file    Eligible for Low Dose CT? No  UTD with eye doctor? Does not go UTD with dentist? yes     06/26/2022    8:26 AM  Depression screen PHQ 2/9  Decreased Interest 0  Down, Depressed, Hopeless 0  PHQ - 2 Score 0    Other providers/specialists: Patient Care Team: Jarold Motto, [place] as PCP - General (Physician Assistant)    PMHx, SurgHx, SocialHx, Medications, and Allergies were reviewed in the Visit Navigator and updated as appropriate.   Past Medical History:  Diagnosis Date   Atypical mole    followed by dermatology   Shingles 03/21/2016     Past Surgical History:  Procedure Laterality Date   FRACTURE SURGERY  2017   Nose   WISDOM TOOTH EXTRACTION Bilateral 2013     Family History  Problem Relation Age  of Onset   Alcohol abuse Paternal Uncle    Depression Paternal Aunt    Colon cancer Neg Hx    Prostate cancer Neg Hx     Social History   Tobacco Use   Smoking status: Never   Smokeless tobacco: Never  Substance Use Topics   Alcohol use: Yes    Comment: 6-8 drinks per week   Drug use: No    Review of Systems:   ROS  Objective:    Vitals:   06/26/22 0825  BP: 120/80  Pulse: 65  Temp: (!) 97.5 F (36.4 C)  SpO2: 99%    Body mass index is 24.74 kg/m.  General  Alert, cooperative, no distress, appears stated age  Head:  Normocephalic, without obvious abnormality, atraumatic  Eyes:  PERRL, conjunctiva/corneas clear, EOM's intact, fundi benign, both eyes       Ears:  Normal TM's and external ear canals, both ears  Nose: Nares normal, septum midline, mucosa normal, no drainage or sinus tenderness  Throat: Lips, mucosa, and tongue normal; teeth and gums normal  Neck: Supple, symmetrical, trachea midline, no adenopathy;     thyroid:  No enlargement/tenderness/nodules; no carotid bruit or JVD  Back:   Symmetric, no curvature, ROM normal, no CVA tenderness  Lungs:   Clear  to auscultation bilaterally, respirations unlabored  Chest wall:  No tenderness or deformity  Heart:  Regular rate and rhythm, S1 and S2 normal, no murmur, rub or gallop  Abdomen:   Soft, non-tender, bowel sounds active all four quadrants, no masses, no organomegaly  Extremities: Extremities normal, atraumatic, no cyanosis or edema  Prostate : Deferred   Skin: Skin color, texture, turgor normal, no rashes or lesions  Lymph nodes: Cervical, supraclavicular, and axillary nodes normal  Neurologic: CNII-XII grossly intact. Normal strength, sensation and reflexes throughout   AssessmentPlan:   Routine physical examination Today patient counseled on age appropriate routine health concerns for screening and prevention, each reviewed and up to date or declined. Immunizations reviewed and up to date or  declined. Labs ordered and reviewed. Risk factors for depression reviewed and negative. Hearing function and visual acuity are intact. ADLs screened and addressed as needed. Functional ability and level of safety reviewed and appropriate. Education, counseling and referrals performed based on assessed risks today. Patient provided with a copy of personalized plan for preventive services.  Hyperlipidemia, unspecified hyperlipidemia type Update lipid panel and make recommendations accordingly    Jarold Motto, PA-C [place] [facility]

## 2022-07-27 ENCOUNTER — Encounter: Payer: Self-pay | Admitting: Physician Assistant

## 2022-07-27 ENCOUNTER — Other Ambulatory Visit: Payer: Self-pay | Admitting: Physician Assistant

## 2022-07-27 ENCOUNTER — Other Ambulatory Visit (INDEPENDENT_AMBULATORY_CARE_PROVIDER_SITE_OTHER): Payer: 59

## 2022-07-27 DIAGNOSIS — R710 Precipitous drop in hematocrit: Secondary | ICD-10-CM | POA: Diagnosis not present

## 2022-07-27 LAB — CBC WITH DIFFERENTIAL/PLATELET
Basophils Absolute: 0 10*3/uL (ref 0.0–0.1)
Basophils Relative: 0.9 % (ref 0.0–3.0)
Eosinophils Absolute: 0 10*3/uL (ref 0.0–0.7)
Eosinophils Relative: 0.5 % (ref 0.0–5.0)
HCT: 36.3 % — ABNORMAL LOW (ref 39.0–52.0)
Hemoglobin: 11.2 g/dL — ABNORMAL LOW (ref 13.0–17.0)
Lymphocytes Relative: 30.6 % (ref 12.0–46.0)
Lymphs Abs: 1.2 10*3/uL (ref 0.7–4.0)
MCHC: 31 g/dL (ref 30.0–36.0)
MCV: 72.6 fl — ABNORMAL LOW (ref 78.0–100.0)
Monocytes Absolute: 0.4 10*3/uL (ref 0.1–1.0)
Monocytes Relative: 10.5 % (ref 3.0–12.0)
Neutro Abs: 2.2 10*3/uL (ref 1.4–7.7)
Neutrophils Relative %: 57.5 % (ref 43.0–77.0)
Platelets: 196 10*3/uL (ref 150.0–400.0)
RBC: 5 Mil/uL (ref 4.22–5.81)
RDW: 15.7 % — ABNORMAL HIGH (ref 11.5–15.5)
WBC: 3.9 10*3/uL — ABNORMAL LOW (ref 4.0–10.5)

## 2022-08-15 ENCOUNTER — Other Ambulatory Visit (INDEPENDENT_AMBULATORY_CARE_PROVIDER_SITE_OTHER): Payer: 59

## 2022-08-15 DIAGNOSIS — R710 Precipitous drop in hematocrit: Secondary | ICD-10-CM

## 2022-08-15 LAB — IBC + FERRITIN
Ferritin: 2.1 ng/mL — ABNORMAL LOW (ref 22.0–322.0)
Iron: 29 ug/dL — ABNORMAL LOW (ref 42–165)
Saturation Ratios: 6.5 % — ABNORMAL LOW (ref 20.0–50.0)
TIBC: 446.6 ug/dL (ref 250.0–450.0)
Transferrin: 319 mg/dL (ref 212.0–360.0)

## 2022-08-15 LAB — CBC WITH DIFFERENTIAL/PLATELET
Basophils Absolute: 0 10*3/uL (ref 0.0–0.1)
Basophils Relative: 1 % (ref 0.0–3.0)
Eosinophils Absolute: 0 10*3/uL (ref 0.0–0.7)
Eosinophils Relative: 0.4 % (ref 0.0–5.0)
HCT: 38.4 % — ABNORMAL LOW (ref 39.0–52.0)
Hemoglobin: 12.1 g/dL — ABNORMAL LOW (ref 13.0–17.0)
Lymphocytes Relative: 38.5 % (ref 12.0–46.0)
Lymphs Abs: 1.8 10*3/uL (ref 0.7–4.0)
MCHC: 31.4 g/dL (ref 30.0–36.0)
MCV: 71.6 fl — ABNORMAL LOW (ref 78.0–100.0)
Monocytes Absolute: 0.4 10*3/uL (ref 0.1–1.0)
Monocytes Relative: 8.4 % (ref 3.0–12.0)
Neutro Abs: 2.4 10*3/uL (ref 1.4–7.7)
Neutrophils Relative %: 51.7 % (ref 43.0–77.0)
Platelets: 179 10*3/uL (ref 150.0–400.0)
RBC: 5.36 Mil/uL (ref 4.22–5.81)
RDW: 16.3 % — ABNORMAL HIGH (ref 11.5–15.5)
WBC: 4.7 10*3/uL (ref 4.0–10.5)

## 2022-08-15 LAB — VITAMIN B12: Vitamin B-12: 475 pg/mL (ref 211–911)

## 2022-08-16 ENCOUNTER — Other Ambulatory Visit (INDEPENDENT_AMBULATORY_CARE_PROVIDER_SITE_OTHER): Payer: 59

## 2022-08-16 DIAGNOSIS — R710 Precipitous drop in hematocrit: Secondary | ICD-10-CM | POA: Diagnosis not present

## 2022-08-16 LAB — PATHOLOGIST SMEAR REVIEW

## 2022-08-16 LAB — FECAL OCCULT BLOOD, IMMUNOCHEMICAL: Fecal Occult Bld: NEGATIVE

## 2022-08-31 ENCOUNTER — Other Ambulatory Visit: Payer: Self-pay | Admitting: *Deleted

## 2022-08-31 ENCOUNTER — Telehealth: Payer: Self-pay | Admitting: Physician Assistant

## 2022-08-31 DIAGNOSIS — R710 Precipitous drop in hematocrit: Secondary | ICD-10-CM

## 2022-08-31 DIAGNOSIS — E611 Iron deficiency: Secondary | ICD-10-CM

## 2022-08-31 NOTE — Telephone Encounter (Signed)
Patient called to get scheduled for lab only visit for iron recheck in 3 months. He has been scheduled for 10/10 @ 8:30am. Can we place future labs?

## 2022-08-31 NOTE — Progress Notes (Signed)
cbc

## 2022-08-31 NOTE — Telephone Encounter (Signed)
Future lab order  

## 2022-10-05 ENCOUNTER — Ambulatory Visit: Payer: 59 | Admitting: Family Medicine

## 2022-10-05 ENCOUNTER — Ambulatory Visit: Payer: 59 | Admitting: Family

## 2022-10-05 VITALS — BP 134/76 | HR 82 | Resp 19 | Ht 69.0 in | Wt 165.0 lb

## 2022-10-05 DIAGNOSIS — D509 Iron deficiency anemia, unspecified: Secondary | ICD-10-CM

## 2022-10-05 NOTE — Progress Notes (Signed)
Brian Brady is a 37 y.o. male with the following history as recorded in EpicCare:  There are no problems to display for this patient.   Current Outpatient Medications  Medication Sig Dispense Refill   Ascorbic Acid (VITAMIN C) 100 MG tablet Take 100 mg by mouth daily.     ferrous sulfate 324 MG TBEC Take 324 mg by mouth.     Multiple Vitamin (MULTIVITAMIN) tablet Take 1 tablet by mouth daily.     Omega-3 Fatty Acids (FISH OIL PO) Take by mouth.     No current facility-administered medications for this visit.    Allergies: Amoxicillin  Past Medical History:  Diagnosis Date   Atypical mole    followed by dermatology   Shingles 03/21/2016    Past Surgical History:  Procedure Laterality Date   FRACTURE SURGERY  2017   Nose   WISDOM TOOTH EXTRACTION Bilateral 2013    Family History  Problem Relation Age of Onset   Alcohol abuse Paternal Uncle    Depression Paternal Aunt    Colon cancer Neg Hx    Prostate cancer Neg Hx     Social History   Tobacco Use   Smoking status: Never   Smokeless tobacco: Never  Substance Use Topics   Alcohol use: Yes    Comment: 6-8 drinks per week    Subjective:   Seen as U/C visit today- regular PCP is unavailable; Notes that on Wednesday of this week, had body aches, restless feeling/ "just felt off." Did take home COVID test which was negative; slept better last night but concerned about other condition that could be brewing with holiday weekend coming;  No fever, cough/ congestion; no abdominal pain or bowel changes;  Is being followed for unexplained anemia- thought to be related to regular blood donations; last hemoglobin was at 12 but ferritin was at 2; is currently taking OTC iron; does have stress at home- 35 mo old with congenital heart defect plus 2 other children ( 67 yo, 4 yo);   Objective:  Vitals:   10/05/22 1427  BP: 134/76  Pulse: 82  Resp: 19  SpO2: 96%  Weight: 165 lb (74.8 kg)  Height: 5\' 9"  (1.753 m)    General: Well  developed, well nourished, in no acute distress  Skin : Warm and dry.  Head: Normocephalic and atraumatic  Eyes: Sclera and conjunctiva clear; pupils round and reactive to light; extraocular movements intact  Ears: External normal; canals clear; tympanic membranes normal  Oropharynx: Pink, supple. No suspicious lesions  Neck: Supple without thyromegaly, adenopathy  Lungs: Respirations unlabored; clear to auscultation bilaterally without wheeze, rales, rhonchi  CVS exam: normal rate and regular rhythm.  Abdomen: Soft; nontender; nondistended; normoactive bowel sounds; no masses or hepatosplenomegaly  Musculoskeletal: No deformities; no active joint inflammation  Extremities: No edema, cyanosis, clubbing  Vessels: Symmetric bilaterally  Neurologic: Alert and oriented; speech intact; face symmetrical; moves all extremities well; CNII-XII intact without focal deficit   Assessment:  1. Iron deficiency anemia, unspecified iron deficiency anemia type     Plan:  Physical exam today is reassuring; ? If patient had recent viral illness; Discussed concern about his ferritin- may need to go ahead with iron infusions if level continues to be low; to also consider GI consult to rule out ulcer; he is aware that I will be forwarding results from today to his PCP;   No follow-ups on file.  Orders Placed This Encounter  Procedures   CBC with Differential/Platelet  Comp Met (CMET)   Iron, TIBC and Ferritin Panel    Requested Prescriptions    No prescriptions requested or ordered in this encounter

## 2022-10-06 LAB — COMPREHENSIVE METABOLIC PANEL
AG Ratio: 1.9 (calc) (ref 1.0–2.5)
ALT: 15 U/L (ref 9–46)
AST: 17 U/L (ref 10–40)
Albumin: 4.5 g/dL (ref 3.6–5.1)
Alkaline phosphatase (APISO): 43 U/L (ref 36–130)
BUN: 13 mg/dL (ref 7–25)
CO2: 28 mmol/L (ref 20–32)
Calcium: 9.7 mg/dL (ref 8.6–10.3)
Chloride: 100 mmol/L (ref 98–110)
Creat: 1.14 mg/dL (ref 0.60–1.26)
Globulin: 2.4 g/dL (ref 1.9–3.7)
Glucose, Bld: 87 mg/dL (ref 65–99)
Potassium: 4.3 mmol/L (ref 3.5–5.3)
Sodium: 140 mmol/L (ref 135–146)
Total Bilirubin: 0.4 mg/dL (ref 0.2–1.2)
Total Protein: 6.9 g/dL (ref 6.1–8.1)

## 2022-10-06 LAB — CBC WITH DIFFERENTIAL/PLATELET
Absolute Monocytes: 855 {cells}/uL (ref 200–950)
Basophils Absolute: 29 {cells}/uL (ref 0–200)
Basophils Relative: 0.5 %
Eosinophils Absolute: 40 {cells}/uL (ref 15–500)
Eosinophils Relative: 0.7 %
HCT: 46 % (ref 38.5–50.0)
Hemoglobin: 14.7 g/dL (ref 13.2–17.1)
Lymphs Abs: 1117 {cells}/uL (ref 850–3900)
MCH: 24.8 pg — ABNORMAL LOW (ref 27.0–33.0)
MCHC: 32 g/dL (ref 32.0–36.0)
MCV: 77.7 fL — ABNORMAL LOW (ref 80.0–100.0)
MPV: 12.1 fL (ref 7.5–12.5)
Monocytes Relative: 15 %
Neutro Abs: 3659 {cells}/uL (ref 1500–7800)
Neutrophils Relative %: 64.2 %
Platelets: 151 10*3/uL (ref 140–400)
RBC: 5.92 10*6/uL — ABNORMAL HIGH (ref 4.20–5.80)
RDW: 21.1 % — ABNORMAL HIGH (ref 11.0–15.0)
Total Lymphocyte: 19.6 %
WBC: 5.7 10*3/uL (ref 3.8–10.8)

## 2022-10-06 LAB — IRON,TIBC AND FERRITIN PANEL
%SAT: 30 % (ref 20–48)
Ferritin: 14 ng/mL — ABNORMAL LOW (ref 38–380)
Iron: 107 ug/dL (ref 50–180)
TIBC: 357 ug/dL (ref 250–425)

## 2022-10-09 ENCOUNTER — Encounter: Payer: Self-pay | Admitting: Physician Assistant

## 2022-10-09 ENCOUNTER — Other Ambulatory Visit: Payer: Self-pay | Admitting: Physician Assistant

## 2022-10-09 DIAGNOSIS — D509 Iron deficiency anemia, unspecified: Secondary | ICD-10-CM

## 2022-10-11 ENCOUNTER — Other Ambulatory Visit: Payer: Self-pay | Admitting: *Deleted

## 2022-10-11 DIAGNOSIS — D509 Iron deficiency anemia, unspecified: Secondary | ICD-10-CM

## 2022-10-19 ENCOUNTER — Encounter: Payer: Self-pay | Admitting: Gastroenterology

## 2022-11-05 ENCOUNTER — Inpatient Hospital Stay: Payer: 59 | Admitting: Nurse Practitioner

## 2022-11-05 ENCOUNTER — Inpatient Hospital Stay: Payer: 59 | Attending: Oncology

## 2022-11-05 ENCOUNTER — Encounter: Payer: Self-pay | Admitting: Nurse Practitioner

## 2022-11-05 VITALS — BP 129/88 | HR 64 | Temp 98.2°F | Resp 18 | Ht 69.0 in | Wt 166.1 lb

## 2022-11-05 DIAGNOSIS — D509 Iron deficiency anemia, unspecified: Secondary | ICD-10-CM | POA: Insufficient documentation

## 2022-11-05 LAB — CBC WITH DIFFERENTIAL (CANCER CENTER ONLY)
Abs Immature Granulocytes: 0.01 10*3/uL (ref 0.00–0.07)
Basophils Absolute: 0 10*3/uL (ref 0.0–0.1)
Basophils Relative: 0 %
Eosinophils Absolute: 0.1 10*3/uL (ref 0.0–0.5)
Eosinophils Relative: 1 %
HCT: 47.4 % (ref 39.0–52.0)
Hemoglobin: 15.7 g/dL (ref 13.0–17.0)
Immature Granulocytes: 0 %
Lymphocytes Relative: 30 %
Lymphs Abs: 1.7 10*3/uL (ref 0.7–4.0)
MCH: 26.9 pg (ref 26.0–34.0)
MCHC: 33.1 g/dL (ref 30.0–36.0)
MCV: 81.2 fL (ref 80.0–100.0)
Monocytes Absolute: 0.4 10*3/uL (ref 0.1–1.0)
Monocytes Relative: 8 %
Neutro Abs: 3.5 10*3/uL (ref 1.7–7.7)
Neutrophils Relative %: 61 %
Platelet Count: 151 10*3/uL (ref 150–400)
RBC: 5.84 MIL/uL — ABNORMAL HIGH (ref 4.22–5.81)
RDW: 19.9 % — ABNORMAL HIGH (ref 11.5–15.5)
WBC Count: 5.7 10*3/uL (ref 4.0–10.5)
nRBC: 0 % (ref 0.0–0.2)

## 2022-11-05 LAB — URINALYSIS, COMPLETE (UACMP) WITH MICROSCOPIC
Bacteria, UA: NONE SEEN
Bilirubin Urine: NEGATIVE
Glucose, UA: NEGATIVE mg/dL
Hgb urine dipstick: NEGATIVE
Ketones, ur: NEGATIVE mg/dL
Leukocytes,Ua: NEGATIVE
Nitrite: NEGATIVE
Protein, ur: NEGATIVE mg/dL
Specific Gravity, Urine: 1.015 (ref 1.005–1.030)
pH: 6.5 (ref 5.0–8.0)

## 2022-11-05 LAB — SAVE SMEAR(SSMR), FOR PROVIDER SLIDE REVIEW

## 2022-11-05 LAB — FERRITIN: Ferritin: 8 ng/mL — ABNORMAL LOW (ref 24–336)

## 2022-11-05 NOTE — Progress Notes (Signed)
New Hematology/Oncology Consult   Requesting MD: Jarold Motto, [place]  [phone]    Reason for Consult: Iron deficiency anemia  HPI: Brian Brady is a 37 year old man referred for evaluation of iron deficiency anemia.  CBC completed 05/23/2021 showed new red cell microcytosis in the absence of anemia.  CBC 06/26/2022 showed a microcytic anemia, hemoglobin 11.6/MCV 72, RDW elevated at 16.8.  CBC 07/27/2022 with hemoglobin 11.2, MCV 72, RDW 15; 08/15/2022 hemoglobin 12.1, MCV 71, RDW 16.3, iron 29, saturation 6%, ferritin 2, stool negative for occult blood.  He was started on oral iron and instructed to discontinue blood donations.  Most recent labs from 10/05/2022 with hemoglobin 14.7, MCV 77, RDW 21, iron 107, percent saturation 30, ferritin 14.  He was referred to hematology and gastroenterology.  He is not aware of any bleeding.  No change in bowel habits.  No bloody or black stools.  No hematuria.  He does not crave ice.  No tongue soreness.  Nails are not brittle.  He eats a regular diet.  He is tolerating oral iron without difficulty.  No constipation or nausea.  Since 2020 he has been donating blood every 60 days.   Past Medical History:  Diagnosis Date   Atypical mole    followed by dermatology   Shingles 03/21/2016     Past Surgical History:  Procedure Laterality Date   FRACTURE SURGERY  2017   Nose   WISDOM TOOTH EXTRACTION Bilateral 2013     Current Outpatient Medications:    Ascorbic Acid (VITAMIN C) 100 MG tablet, Take 100 mg by mouth daily., Disp: , Rfl:    ferrous sulfate 324 MG TBEC, Take 324 mg by mouth., Disp: , Rfl:    Multiple Vitamin (MULTIVITAMIN) tablet, Take 1 tablet by mouth daily., Disp: , Rfl:    Omega-3 Fatty Acids (FISH OIL PO), Take by mouth., Disp: , Rfl: :     Allergies  Allergen Reactions   Amoxicillin     When a baby    FH: No family history of malignancy.  SOCIAL HISTORY: He lives in [place].  He is married.  He has 3 children.  His  youngest child has a congenital heart defect.  No tobacco use.  Alcohol consumption is estimated at 6-8 beverages per week.  Review of Systems: No fevers or sweats.  He has a good appetite.  No shortness of breath.  No dysphagia.  No numbness or tingling in the hands or feet.  Physical Exam:  Blood pressure 129/88, pulse 64, temperature 98.2 F (36.8 C), temperature source Temporal, resp. rate 18, height 5\' 9"  (1.753 m), weight 166 lb 1.6 oz (75.3 kg), SpO2 100%.  HEENT: No thrush. Lungs: Lungs clear bilaterally. Cardiac: Regular rate and rhythm. Abdomen: No hepatosplenomegaly. Vascular: No leg edema. Lymph nodes: No palpable cervical, supraclavicular, axillary or inguinal lymph nodes. Neurologic: Alert and oriented. Skin: No rash.  LABS:   Recent Labs    11/05/22 1404  WBC 5.7  HGB 15.7  HCT 47.4  PLT 151  Peripheral blood smear-teardrops, ovalocytes, few target cells, rare cigar cell; white cell morphology unremarkable; platelets normal in number, few large and rare giant platelets  No results for input(s): "NA", "K", "CL", "CO2", "GLUCOSE", "BUN", "CREATININE", "CALCIUM" in the last 72 hours.    RADIOLOGY:  No results found.  Assessment and Plan:   Iron deficiency anemia Frequent blood donor  Brian Brady was recently found to have iron deficiency anemia.  Hemoglobin and MCV have corrected into normal  range since he began oral iron.  Frequent blood donation may be the source of iron deficiency.  He will complete stool cards and submit a urine specimen.  He has an appointment with gastroenterology in the next few months.  The ferritin remains low.  He will increase oral iron to daily.  He will return for lab and follow-up in 2 months.  Patient seen with Dr. Truett Perna.    Brian Cobb, NP 11/05/2022, 3:04 PM  This was a shared visit with Brian Brady.  Brian Brady was interviewed and examined.  I reviewed the peripheral blood smear.  He is referred for evaluation of iron  deficiency anemia.  The peripheral blood smear findings are consistent with iron deficiency.  He is risk factor for developing iron deficiency is frequent blood donation.  There is no apparent bleeding.  I think it is likely that iron deficiency anemia is related to blood donation.  He we will stop donating blood.  We recommended he begin iron daily.  He will return for an office visit and CBC in 2 months.  He is scheduled to see Dr. Myrtie Neither to consider the indication for colonoscopy.  I was present for greater than 50% of today's visit.  I performed medical decision making.  Mancel Bale, MD

## 2022-11-06 ENCOUNTER — Inpatient Hospital Stay: Payer: 59 | Attending: Nurse Practitioner

## 2022-11-06 ENCOUNTER — Encounter: Payer: Self-pay | Admitting: Physician Assistant

## 2022-11-06 DIAGNOSIS — D509 Iron deficiency anemia, unspecified: Secondary | ICD-10-CM | POA: Insufficient documentation

## 2022-11-07 DIAGNOSIS — D509 Iron deficiency anemia, unspecified: Secondary | ICD-10-CM | POA: Diagnosis not present

## 2022-11-08 DIAGNOSIS — D509 Iron deficiency anemia, unspecified: Secondary | ICD-10-CM | POA: Diagnosis not present

## 2022-11-09 ENCOUNTER — Telehealth: Payer: Self-pay | Admitting: Gastroenterology

## 2022-11-09 ENCOUNTER — Other Ambulatory Visit (HOSPITAL_BASED_OUTPATIENT_CLINIC_OR_DEPARTMENT_OTHER): Payer: Self-pay | Admitting: Emergency Medicine

## 2022-11-09 DIAGNOSIS — D509 Iron deficiency anemia, unspecified: Secondary | ICD-10-CM

## 2022-11-09 LAB — OCCULT BLOOD X 1 CARD TO LAB, STOOL
Fecal Occult Bld: POSITIVE — AB
Fecal Occult Bld: POSITIVE — AB
Fecal Occult Bld: POSITIVE — AB

## 2022-11-09 NOTE — Telephone Encounter (Signed)
Brian Brady,  This man was referred to [place] by oncology and primary care for heme positive stool and iron deficiency anemia.  I received a note from the hematology consultant telling me that they initially believed the IDA was due to to frequent blood donation because a stool card was negative for occult blood in July.  His hemoglobin has since normalized after IV iron treatments, but his ferritin remains low indicating. However, repeat stool cards x 3 are positive for occult blood.  He is scheduled for an office appointment with me 2 months from now, and that is the next available office visit that I have. I would like to expedite his endoscopic workup by having him directly booked for an EGD and colonoscopy with me in the [facility] if he is agreeable.  Please contact him, give him my thoughts noted above, and make the arrangements if he is ready to proceed.  Ellwood Dense, MD

## 2022-11-09 NOTE — Telephone Encounter (Signed)
Pt made aware of Dr. Myrtie Neither recommendations: Pt was scheduled for a previsit on 11/27/2022 at 3:30 PM. Pt was scheduled for an EGD/ Colonoscopy on 12/05/2022 at 2:30 PM. Pt made aware.  Pt verbalized understanding with all questions answered.

## 2022-11-12 ENCOUNTER — Telehealth: Payer: Self-pay

## 2022-11-12 NOTE — Telephone Encounter (Signed)
-----   Message from Lonna Cobb sent at 11/09/2022  3:46 PM EDT ----- Please let him know the stool cards returned positive for blood.  We are trying to get him a sooner appointment with GI.

## 2022-11-12 NOTE — Telephone Encounter (Signed)
Patient gave verbal understanding and had no further questions or concern

## 2022-11-15 ENCOUNTER — Other Ambulatory Visit: Payer: 59

## 2022-11-27 ENCOUNTER — Ambulatory Visit: Payer: 59

## 2022-11-27 ENCOUNTER — Encounter: Payer: Self-pay | Admitting: Gastroenterology

## 2022-11-27 ENCOUNTER — Other Ambulatory Visit (HOSPITAL_BASED_OUTPATIENT_CLINIC_OR_DEPARTMENT_OTHER): Payer: Self-pay

## 2022-11-27 VITALS — Ht 69.0 in | Wt 166.8 lb

## 2022-11-27 DIAGNOSIS — R195 Other fecal abnormalities: Secondary | ICD-10-CM

## 2022-11-27 DIAGNOSIS — D509 Iron deficiency anemia, unspecified: Secondary | ICD-10-CM

## 2022-11-27 MED ORDER — NA SULFATE-K SULFATE-MG SULF 17.5-3.13-1.6 GM/177ML PO SOLN
1.0000 | Freq: Once | ORAL | 0 refills | Status: AC
Start: 2022-11-27 — End: 2022-11-29
  Filled 2022-11-27: qty 354, 1d supply, fill #0

## 2022-11-27 NOTE — Progress Notes (Signed)
No egg or soy allergy known to patient  No issues known to pt with past sedation with any surgeries or procedures Patient denies ever being told they had issues or difficulty with intubation  No FH of Malignant Hyperthermia Pt is not on diet pills Pt is not on  home 02  Pt is not on blood thinners  Pt denies issues with constipation  No A fib or A flutter Have any cardiac testing pending--no  LOA: independent  Prep: suprep   Patient's chart reviewed by Cathlyn Parsons CNRA prior to previsit and patient appropriate for the [facility].  Previsit completed and red dot placed by patient's name on their procedure day (on provider's schedule).     PV competed with patient. Prep instructions sent via mychart and hard copy given at [facility] apt

## 2022-11-28 ENCOUNTER — Other Ambulatory Visit (HOSPITAL_BASED_OUTPATIENT_CLINIC_OR_DEPARTMENT_OTHER): Payer: Self-pay

## 2022-11-29 ENCOUNTER — Other Ambulatory Visit (HOSPITAL_BASED_OUTPATIENT_CLINIC_OR_DEPARTMENT_OTHER): Payer: Self-pay

## 2022-12-03 ENCOUNTER — Telehealth: Payer: Self-pay

## 2022-12-03 NOTE — Telephone Encounter (Signed)
Call pt to r/s endocolon for 10/31, pt not sure due to halloween with his children, however, let him know as the 1st case of the afternoon he would most likely be finishing up by 3-315 potentially, pt state he will sched for now and call and r/s if he decides he doesn't want to interfere with their family plans, let pt know I will sent instructions update in mychart and to call back if he needs to r/s, pt verb understanding.

## 2022-12-03 NOTE — Telephone Encounter (Signed)
Understood, thank you.  If he does contact us to reschedule, that may be done without need for an office visit.  Ellwood Dense MD

## 2022-12-05 ENCOUNTER — Encounter: Payer: 59 | Admitting: Gastroenterology

## 2022-12-06 ENCOUNTER — Encounter: Payer: Self-pay | Admitting: Gastroenterology

## 2022-12-06 ENCOUNTER — Encounter: Payer: Self-pay | Admitting: Physician Assistant

## 2022-12-06 ENCOUNTER — Ambulatory Visit: Payer: 59 | Admitting: Gastroenterology

## 2022-12-06 VITALS — BP 108/69 | HR 80 | Temp 98.4°F | Resp 18

## 2022-12-06 DIAGNOSIS — Z1211 Encounter for screening for malignant neoplasm of colon: Secondary | ICD-10-CM

## 2022-12-06 DIAGNOSIS — R195 Other fecal abnormalities: Secondary | ICD-10-CM | POA: Diagnosis not present

## 2022-12-06 DIAGNOSIS — K209 Esophagitis, unspecified without bleeding: Secondary | ICD-10-CM

## 2022-12-06 DIAGNOSIS — D509 Iron deficiency anemia, unspecified: Secondary | ICD-10-CM | POA: Diagnosis not present

## 2022-12-06 DIAGNOSIS — K2100 Gastro-esophageal reflux disease with esophagitis, without bleeding: Secondary | ICD-10-CM | POA: Diagnosis not present

## 2022-12-06 DIAGNOSIS — K633 Ulcer of intestine: Secondary | ICD-10-CM | POA: Diagnosis not present

## 2022-12-06 DIAGNOSIS — K529 Noninfective gastroenteritis and colitis, unspecified: Secondary | ICD-10-CM | POA: Diagnosis not present

## 2022-12-06 MED ORDER — SODIUM CHLORIDE 0.9 % IV SOLN: 500.0000 mL | Freq: Once | INTRAVENOUS | Status: DC

## 2022-12-06 NOTE — Progress Notes (Signed)
History and Physical:  This patient presents for endoscopic testing for: Encounter Diagnoses  Name Primary?   Iron deficiency anemia, unspecified iron deficiency anemia type Yes   Heme positive stool     This is a 37 year old man referred to [place] by the hematology practice for iron deficiency anemia and heme positive stool. He was referred to hematology earlier this year for iron deficiency anemia that was believed due to frequent blood donation.  However, further testing with stools for occult blood were positive earlier this month. He denies any chronic upper or lower digestive symptoms, his bowel habits are regular and he is seeing no black or bloody stools.  Patient is otherwise without complaints or active issues today.   Past Medical History: Past Medical History:  Diagnosis Date   Atypical mole    followed by dermatology   Shingles 03/21/2016     Past Surgical History: Past Surgical History:  Procedure Laterality Date   FRACTURE SURGERY  2017   Nose   WISDOM TOOTH EXTRACTION Bilateral 2013    Allergies: Allergies  Allergen Reactions   Amoxicillin     When a baby    Outpatient Meds: Current Outpatient Medications  Medication Sig Dispense Refill   Ascorbic Acid (VITAMIN C) 100 MG tablet Take 100 mg by mouth daily.     Multiple Vitamin (MULTIVITAMIN) tablet Take 1 tablet by mouth daily.     Omega-3 Fatty Acids (FISH OIL PO) Take by mouth.     ferrous sulfate 324 MG TBEC Take 324 mg by mouth.     Current Facility-Administered Medications  Medication Dose Route Frequency Provider Last Rate Last Admin   0.9 %  sodium chloride infusion  500 mL Intravenous Once Danis, Starr Lake III, MD          ___________________________________________________________________ Objective   Exam:  BP (!) 146/89   Pulse 79   Temp 98.4 F (36.9 C)   Resp 20   SpO2 100%   CV: regular , S1/S2 Resp: clear to auscultation bilaterally, normal RR and effort noted GI: soft, no  tenderness, with active bowel sounds.   Assessment: Encounter Diagnoses  Name Primary?   Iron deficiency anemia, unspecified iron deficiency anemia type Yes   Heme positive stool      Plan: Colonoscopy EGD  If there is no discernible source of occult GI blood loss on either of today's studies, that we will pursue a small bowel video capsule study.  We discussed that prior to the procedure and he was agreeable.  The benefits and risks of the planned procedure were described in detail with the patient or (when appropriate) their health care proxy.  Risks were outlined as including, but not limited to, bleeding, infection, perforation, adverse medication reaction leading to cardiac or pulmonary decompensation, pancreatitis (if ERCP).  The limitation of incomplete mucosal visualization was also discussed.  No guarantees or warranties were given.  The patient is appropriate for an endoscopic procedure in the ambulatory setting.   - Amada Jupiter, MD

## 2022-12-06 NOTE — Progress Notes (Signed)
Called to room to assist during endoscopic procedure.  Patient ID and intended procedure confirmed with present staff. Received instructions for my participation in the procedure from the performing physician.  

## 2022-12-06 NOTE — Op Note (Signed)
[facility] Patient Name: Brian Brady Procedure Date: 12/06/2022 1:13 PM MRN: [id number] Endoscopist: Sherilyn Cooter L. Myrtie Neither , MD, [id number] Age: 37 Referring MD:  Date of Birth: 06-Jan-1986 Gender: Male Account #: [id number] Procedure:                Colonoscopy Indications:              Heme positive stool, Unexplained iron deficiency                            anemia                           See recent clinical notes for details. IDA believed                            to be from frequent blood donation. Recent FOBT x 3                            positive, no chronic GI symptoms. No source on EGD                            today. Medicines:                Monitored Anesthesia Care Procedure:                Pre-Anesthesia Assessment:                           - Prior to the procedure, a History and Physical                            was performed, and patient medications and                            allergies were reviewed. The patient's tolerance of                            previous anesthesia was also reviewed. The risks                            and benefits of the procedure and the sedation                            options and risks were discussed with the patient.                            All questions were answered, and informed consent                            was obtained. Prior Anticoagulants: The patient has                            taken no anticoagulant or antiplatelet agents. ASA  Grade Assessment: II - A patient with mild systemic                            disease. After reviewing the risks and benefits,                            the patient was deemed in satisfactory condition to                            undergo the procedure.                           After obtaining informed consent, the colonoscope                            was passed under direct vision. Throughout the                            procedure, the  patient's blood pressure, pulse, and                            oxygen saturations were monitored continuously. The                            Olympus Scope [id number] was introduced through the                            anus and advanced to the 10 cm into the ileum. The                            colonoscopy was performed without difficulty. The                            patient tolerated the procedure well. The quality                            of the bowel preparation was excellent. The                            terminal ileum, ileocecal valve, appendiceal                            orifice, and rectum were photographed. Scope In: 1:37:12 PM Scope Out: 1:50:16 PM Scope Withdrawal Time: 0 hours 10 minutes 10 seconds  Total Procedure Duration: 0 hours 13 minutes 4 seconds  Findings:                 The perianal and digital rectal examinations were                            normal.                           The terminal ileum contained a few diminutive  ulcers. No bleeding was present. No stigmata of                            recent bleeding were seen. The ileal mucosa was                            otherwise normal. Biopsies were taken with a cold                            forceps for histology. (To rule out microscopic                            changes of Crohn's disease, though considered                            clinically unlikely)                           Repeat examination of right colon under NBI                            performed.                           Normal mucosa was found in the entire colon.                           A few small-mouthed diverticula were found in the                            left colon.                           Internal hemorrhoids were found. The hemorrhoids                            were small and Grade I (internal hemorrhoids that                            do not prolapse).                           The  exam was otherwise without abnormality on                            direct and retroflexion views. Complications:            No immediate complications. Estimated Blood Loss:     Estimated blood loss was minimal. Impression:               - A few ulcers in the terminal ileum. Biopsied.                           - Normal mucosa in the entire examined colon.                           -  Diverticulosis in the left colon.                           - Internal hemorrhoids.                           - The examination was otherwise normal on direct                            and retroflexion views.                           While these small ileal ulcers explain heme                            positive stool, it is not clear to what extent they                            are responsible for the iron deficiency anemia.                            This patient has reportedly stopped regularly                            donating blood while undergoing iron replacement                            treatment. Recommendation:           - Patient has a contact number available for                            emergencies. The signs and symptoms of potential                            delayed complications were discussed with the                            patient. Return to normal activities tomorrow.                            Written discharge instructions were provided to the                            patient.                           - Resume previous diet.                           - Continue present medications.                           - Await pathology results.                           - Repeat colonoscopy in 10 years for  screening                            purposes.                           - Return to hematology consultant for further                            monitoring of IDA.                           Try to discontinue use of any aspirin or NSAID                            containing  products if possible, or at least                            minimize their use. Klani Caridi L. Myrtie Neither, MD 12/06/2022 1:58:30 PM This report has been signed electronically.

## 2022-12-06 NOTE — Op Note (Signed)
[facility] Patient Name: Brian Brady Procedure Date: 12/06/2022 1:14 PM MRN: [id number] Endoscopist: Sherilyn Cooter L. Myrtie Neither , MD, [id number] Age: 37 Referring MD:  Date of Birth: [birth year] Gender: Male Account #: [id number] Procedure:                Upper GI endoscopy Indications:              Unexplained iron deficiency anemia, Heme positive                            stool                           No GI symptoms                           see recent hematology notes and today's H+P for                            clinical details Medicines:                Monitored Anesthesia Care Procedure:                Pre-Anesthesia Assessment:                           - Prior to the procedure, a History and Physical                            was performed, and patient medications and                            allergies were reviewed. The patient's tolerance of                            previous anesthesia was also reviewed. The risks                            and benefits of the procedure and the sedation                            options and risks were discussed with the patient.                            All questions were answered, and informed consent                            was obtained. Prior Anticoagulants: The patient has                            taken no anticoagulant or antiplatelet agents. ASA                            Grade Assessment: II - A patient with mild systemic  disease. After reviewing the risks and benefits,                            the patient was deemed in satisfactory condition to                            undergo the procedure.                           After obtaining informed consent, the endoscope was                            passed under direct vision. Throughout the                            procedure, the patient's blood pressure, pulse, and                            oxygen saturations were monitored continuously.  The                            Olympus Scope [id number] was introduced through the                            mouth, and advanced to the second part of duodenum.                            The upper GI endoscopy was accomplished without                            difficulty. The patient tolerated the procedure                            well. Scope In: Scope Out: Findings:                 The larynx was normal.                           LA Grade A (one or more mucosal breaks less than 5                            mm, not extending between tops of 2 mucosal folds)                            esophagitis with no bleeding was found at the                            gastroesophageal junction.                           The exam of the esophagus was otherwise normal.                           The stomach was normal.  The cardia and gastric fundus were normal on                            retroflexion.                           The examined duodenum was normal. Complications:            No immediate complications. Estimated Blood Loss:     Estimated blood loss: none. Impression:               - Normal larynx.                           - LA Grade A reflux esophagitis with no bleeding.                           - Normal stomach.                           - Normal examined duodenum.                           - No specimens collected.                           The very mild reflux-related finding in the                            esophagus does not appear to explain heme positive                            stool or IDA. Medication is not currently                            recommended as patient is not reporting chronic                            heartburn. Recommendation:           - Patient has a contact number available for                            emergencies. The signs and symptoms of potential                            delayed complications were discussed with  the                            patient. Return to normal activities tomorrow.                            Written discharge instructions were provided to the                            patient.                           -  Resume previous diet.                           - Continue present medications.                           - See the other procedure note for documentation of                            additional recommendations. Demone Lyles L. Myrtie Neither, MD 12/06/2022 1:35:44 PM This report has been signed electronically.

## 2022-12-06 NOTE — Progress Notes (Signed)
Pt's states no medical or surgical changes since previsit or office visit. 

## 2022-12-06 NOTE — Patient Instructions (Signed)
-Handout on esophagitis, diverticulosis and hemorrhoids provided -await pathology results -repeat colonoscopy in 10 years for surveillance recommended.  -Continue present medications   -Return to hematology consult for further monitoring of IDA -Try discontinue use of aspirin or NSAID containing products if possible or at least minimize their use.   YOU HAD AN ENDOSCOPIC PROCEDURE TODAY AT THE [facility]:   Refer to the procedure report that was given to you for any specific questions about what was found during the examination.  If the procedure report does not answer your questions, please call your gastroenterologist to clarify.  If you requested that your care partner not be given the details of your procedure findings, then the procedure report has been included in a sealed envelope for you to review at your convenience later.  YOU SHOULD EXPECT: Some feelings of bloating in the abdomen. Passage of more gas than usual.  Walking can help get rid of the air that was put into your GI tract during the procedure and reduce the bloating. If you had a lower endoscopy (such as a colonoscopy or flexible sigmoidoscopy) you may notice spotting of blood in your stool or on the toilet paper. If you underwent a bowel prep for your procedure, you may not have a normal bowel movement for a few days.  Please Note:  You might notice some irritation and congestion in your nose or some drainage.  This is from the oxygen used during your procedure.  There is no need for concern and it should clear up in a day or so.  SYMPTOMS TO REPORT IMMEDIATELY:  Following lower endoscopy (colonoscopy or flexible sigmoidoscopy):  Excessive amounts of blood in the stool  Significant tenderness or worsening of abdominal pains  Swelling of the abdomen that is new, acute  Fever of 100F or higher  Following upper endoscopy (EGD)  Vomiting of blood or coffee ground material  New chest pain or pain under the  shoulder blades  Painful or persistently difficult swallowing  New shortness of breath  Fever of 100F or higher  Black, tarry-looking stools  For urgent or emergent issues, a gastroenterologist can be reached at any hour by calling [phone]. Do not use MyChart messaging for urgent concerns.    DIET:  We do recommend a small meal at first, but then you may proceed to your regular diet.  Drink plenty of fluids but you should avoid alcoholic beverages for 24 hours.  ACTIVITY:  You should plan to take it easy for the rest of today and you should NOT DRIVE or use heavy machinery until tomorrow (because of the sedation medicines used during the test).    FOLLOW UP: Our staff will call the number listed on your records the next business day following your procedure.  We will call around 7:15- 8:00 am to check on you and address any questions or concerns that you may have regarding the information given to you following your procedure. If we do not reach you, we will leave a message.     If any biopsies were taken you will be contacted by phone or by letter within the next 1-3 weeks.  Please call us at [phone] if you have not heard about the biopsies in 3 weeks.    SIGNATURES/CONFIDENTIALITY: You and/or your care partner have signed paperwork which will be entered into your electronic medical record.  These signatures attest to the fact that that the information above on your After Visit Summary has  been reviewed and is understood.  Full responsibility of the confidentiality of this discharge information lies with you and/or your care-partner.

## 2022-12-06 NOTE — Progress Notes (Signed)
1255 Robinul 0.1 mg IV given due large amount of secretions upon assessment.  MD made aware, vss

## 2022-12-06 NOTE — Progress Notes (Signed)
Report given to PACU, vss 

## 2022-12-07 ENCOUNTER — Telehealth: Payer: Self-pay

## 2022-12-07 NOTE — Telephone Encounter (Signed)
  Follow up Call-     12/06/2022   12:41 PM  Call back number  Post procedure Call Back phone  # [phone]  Permission to leave phone message Yes     Patient questions:  Do you have a fever, pain , or abdominal swelling? No. Pain Score  0 *  Have you tolerated food without any problems? Yes.    Have you been able to return to your normal activities? Yes.    Do you have any questions about your discharge instructions: Diet   No. Medications  No. Follow up visit  No.  Do you have questions or concerns about your Care? No.  Actions: * If pain score is 4 or above: No action needed, pain <4.

## 2022-12-11 LAB — SURGICAL PATHOLOGY

## 2022-12-13 ENCOUNTER — Telehealth: Payer: Self-pay

## 2022-12-13 ENCOUNTER — Encounter: Payer: Self-pay | Admitting: Gastroenterology

## 2022-12-13 NOTE — Telephone Encounter (Signed)
Canceled patients OV per Dr Myrtie Neither

## 2023-01-08 ENCOUNTER — Inpatient Hospital Stay: Payer: 59 | Attending: Oncology | Admitting: Nurse Practitioner

## 2023-01-08 ENCOUNTER — Encounter: Payer: Self-pay | Admitting: Nurse Practitioner

## 2023-01-08 ENCOUNTER — Inpatient Hospital Stay: Payer: 59

## 2023-01-08 ENCOUNTER — Other Ambulatory Visit: Payer: 59 | Attending: Oncology

## 2023-01-08 VITALS — BP 113/78 | HR 65 | Temp 98.2°F | Resp 18 | Ht 69.0 in | Wt 168.8 lb

## 2023-01-08 DIAGNOSIS — D509 Iron deficiency anemia, unspecified: Secondary | ICD-10-CM | POA: Insufficient documentation

## 2023-01-08 LAB — CBC WITH DIFFERENTIAL (CANCER CENTER ONLY)
Abs Immature Granulocytes: 0.02 10*3/uL (ref 0.00–0.07)
Basophils Absolute: 0.1 10*3/uL (ref 0.0–0.1)
Basophils Relative: 1 %
Eosinophils Absolute: 0 10*3/uL (ref 0.0–0.5)
Eosinophils Relative: 0 %
HCT: 49.1 % (ref 39.0–52.0)
Hemoglobin: 16.8 g/dL (ref 13.0–17.0)
Immature Granulocytes: 0 %
Lymphocytes Relative: 28 %
Lymphs Abs: 1.8 10*3/uL (ref 0.7–4.0)
MCH: 29.1 pg (ref 26.0–34.0)
MCHC: 34.2 g/dL (ref 30.0–36.0)
MCV: 84.9 fL (ref 80.0–100.0)
Monocytes Absolute: 0.5 10*3/uL (ref 0.1–1.0)
Monocytes Relative: 7 %
Neutro Abs: 4 10*3/uL (ref 1.7–7.7)
Neutrophils Relative %: 64 %
Platelet Count: 176 10*3/uL (ref 150–400)
RBC: 5.78 MIL/uL (ref 4.22–5.81)
RDW: 13.9 % (ref 11.5–15.5)
WBC Count: 6.3 10*3/uL (ref 4.0–10.5)
nRBC: 0 % (ref 0.0–0.2)

## 2023-01-08 LAB — FERRITIN: Ferritin: 18 ng/mL — ABNORMAL LOW (ref 24–336)

## 2023-01-08 NOTE — Progress Notes (Signed)
  [facility] OFFICE PROGRESS NOTE   Diagnosis: Iron deficiency anemia  INTERVAL HISTORY:   Brian Brady returns as scheduled.  He continues oral iron.  No constipation or nausea.  He denies bleeding.  In general he feels well.  Objective:  Vital signs in last 24 hours:  Blood pressure 113/78, pulse 65, temperature 98.2 F (36.8 C), temperature source Temporal, resp. rate 18, height 5\' 9"  (1.753 m), weight 168 lb 12.8 oz (76.6 kg), SpO2 100%.    Resp: Lungs clear bilaterally. Cardio: Regular rate and rhythm. GI: No hepatosplenomegaly. Vascular: No leg edema.  Lab Results:  Lab Results  Component Value Date   WBC 6.3 01/08/2023   HGB 16.8 01/08/2023   HCT 49.1 01/08/2023   MCV 84.9 01/08/2023   PLT 176 01/08/2023   NEUTROABS 4.0 01/08/2023    Imaging:  No results found.  Medications: I have reviewed the patient's current medications.  Assessment/Plan: Iron deficiency anemia Stool positive for occult blood x3 October 2024 12/06/2022 colonoscopy-a few ulcers in the terminal ileum (chronic nonspecific mildly active ileitis, no dysplasia or malignancy).  Per Dr. Myrtie Neither the small ileal ulcers explain heme positive stool, not clear to what extent they are responsible for the iron deficiency anemia 12/06/2022 upper endoscopy-reflux esophagitis, very mild reflux related finding in the esophagus does not appear to explain heme positive stool or iron deficiency anemia Frequent blood donor-discontinued  Disposition: Brian Brady appears stable.  The hemoglobin and MCV have corrected into normal range.  He will discontinue oral iron and return for follow-up in 3 months.  We are available to see him sooner if needed.    Brian Brady ANP/GNP-BC   01/08/2023  2:20 PM

## 2023-01-09 ENCOUNTER — Ambulatory Visit: Payer: 59 | Admitting: Gastroenterology

## 2023-02-06 HISTORY — PX: VASECTOMY: SHX75

## 2023-04-08 ENCOUNTER — Other Ambulatory Visit: Payer: 59

## 2023-04-08 ENCOUNTER — Ambulatory Visit: Payer: 59 | Admitting: Nurse Practitioner

## 2023-04-09 ENCOUNTER — Inpatient Hospital Stay: Payer: 59 | Attending: Oncology

## 2023-04-09 ENCOUNTER — Inpatient Hospital Stay (HOSPITAL_BASED_OUTPATIENT_CLINIC_OR_DEPARTMENT_OTHER): Payer: 59 | Admitting: Nurse Practitioner

## 2023-04-09 ENCOUNTER — Telehealth: Payer: Self-pay

## 2023-04-09 ENCOUNTER — Encounter: Payer: Self-pay | Admitting: Nurse Practitioner

## 2023-04-09 VITALS — BP 120/85 | HR 60 | Temp 98.2°F | Resp 18 | Ht 69.0 in | Wt 169.2 lb

## 2023-04-09 DIAGNOSIS — D509 Iron deficiency anemia, unspecified: Secondary | ICD-10-CM

## 2023-04-09 LAB — CBC WITH DIFFERENTIAL (CANCER CENTER ONLY)
Abs Immature Granulocytes: 0.02 10*3/uL (ref 0.00–0.07)
Basophils Absolute: 0.1 10*3/uL (ref 0.0–0.1)
Basophils Relative: 1 %
Eosinophils Absolute: 0.1 10*3/uL (ref 0.0–0.5)
Eosinophils Relative: 1 %
HCT: 45.4 % (ref 39.0–52.0)
Hemoglobin: 16.1 g/dL (ref 13.0–17.0)
Immature Granulocytes: 0 %
Lymphocytes Relative: 28 %
Lymphs Abs: 2.1 10*3/uL (ref 0.7–4.0)
MCH: 30.7 pg (ref 26.0–34.0)
MCHC: 35.5 g/dL (ref 30.0–36.0)
MCV: 86.5 fL (ref 80.0–100.0)
Monocytes Absolute: 0.5 10*3/uL (ref 0.1–1.0)
Monocytes Relative: 7 %
Neutro Abs: 4.8 10*3/uL (ref 1.7–7.7)
Neutrophils Relative %: 63 %
Platelet Count: 197 10*3/uL (ref 150–400)
RBC: 5.25 MIL/uL (ref 4.22–5.81)
RDW: 12.3 % (ref 11.5–15.5)
WBC Count: 7.5 10*3/uL (ref 4.0–10.5)
nRBC: 0 % (ref 0.0–0.2)

## 2023-04-09 LAB — FERRITIN: Ferritin: 25 ng/mL (ref 24–336)

## 2023-04-09 NOTE — Telephone Encounter (Signed)
 Patient gave verbal understanding and had no further questions or concerns at this time

## 2023-04-09 NOTE — Progress Notes (Signed)
  [facility] OFFICE PROGRESS NOTE   Diagnosis: Iron deficiency anemia  INTERVAL HISTORY:   [name] returns as scheduled.  He discontinued oral iron following the last office visit.  He feels well.  He is no longer donating blood.  He denies bleeding.  He does not crave ice.  No tongue soreness.  Objective:  Vital signs in last 24 hours:  Blood pressure 120/85, pulse 60, temperature 98.2 F (36.8 C), temperature source Temporal, resp. rate 18, height 5\' 9"  (1.753 m), weight 169 lb 3.2 oz (76.7 kg), SpO2 100%.    Resp: Lungs clear bilaterally. Cardio: Regular rate and rhythm. GI: No hepatosplenomegaly. Vascular: No leg edema.   Lab Results:  Lab Results  Component Value Date   WBC 7.5 04/09/2023   HGB 16.1 04/09/2023   HCT 45.4 04/09/2023   MCV 86.5 04/09/2023   PLT 197 04/09/2023   NEUTROABS 4.8 04/09/2023    Imaging:  No results found.  Medications: I have reviewed the patient's current medications.  Assessment/Plan: Iron deficiency anemia Stool positive for occult blood x3 October 2024 12/06/2022 colonoscopy-a few ulcers in the terminal ileum (chronic nonspecific mildly active ileitis, no dysplasia or malignancy).  Per Dr. Myrtie Neither the small ileal ulcers explain heme positive stool, not clear to what extent they are responsible for the iron deficiency anemia 12/06/2022 upper endoscopy-reflux esophagitis, very mild reflux related finding in the esophagus does not appear to explain heme positive stool or iron deficiency anemia Frequent blood donor-discontinued  Disposition: [name] remains stable from a hematologic standpoint.  Hemoglobin and MCV remain corrected into the normal range.  He is no longer taking oral iron.  We will follow-up on the ferritin from today.  He will return for lab and follow-up in 6 months.    Lonna Cobb ANP/GNP-BC   04/09/2023  2:20 PM

## 2023-04-09 NOTE — Telephone Encounter (Signed)
-----   Message from Lonna Cobb sent at 04/09/2023  3:01 PM EST ----- Please let him know ferritin is in low normal range.  He can stay off of oral iron and follow-up as scheduled.

## 2023-06-26 NOTE — Progress Notes (Signed)
 Subjective:    Brian Brady is a 38 y.o. male and is here for a comprehensive physical exam.  HPI  There are no preventive care reminders to display for this patient.  Acute Concerns: Iron deficiency anemia Has had extensive work-up with gastroenterology and hematology He has stopped donating blood He is continuing to have regular check-in for blood work with hematology, at least for one more 6 month(s) check in Denies any overt bleeding  Chronic Issues: None  Health Maintenance: Immunizations --  UTD.  Colonoscopy -- UTD, last done 12/06/22.  PSA -- No results found for: "PSA1", "PSA" Diet -- overall healthy diet Sleep habits -- no major concerns Exercise -- regular exercise as able  Weight --  Recent weight history Wt Readings from Last 10 Encounters:  06/27/23 165 lb (74.8 kg)  04/09/23 169 lb 3.2 oz (76.7 kg)  01/08/23 168 lb 12.8 oz (76.6 kg)  11/27/22 166 lb 12.8 oz (75.7 kg)  11/05/22 166 lb 1.6 oz (75.3 kg)  10/05/22 165 lb (74.8 kg)  06/26/22 167 lb 8 oz (76 kg)  05/23/21 167 lb 3.2 oz (75.8 kg)  05/17/20 166 lb (75.3 kg)  03/11/20 169 lb (76.7 kg)   Body mass index is 24.37 kg/m.  Mood --  Stable.  Alcohol use --  reports current alcohol use.  Tobacco use --  Tobacco Use: Low Risk  (06/27/2023)   Patient History    Smoking Tobacco Use: Never    Smokeless Tobacco Use: Never    Passive Exposure: Not on file    Eligible for Low Dose CT? no  UTD with eye doctor? N/a UTD with dentist? yes     06/27/2023    8:01 AM  Depression screen PHQ 2/9  Decreased Interest 0  Down, Depressed, Hopeless 0  PHQ - 2 Score 0    Other providers/specialists: Patient Care Team: Alexander Iba, [place] as PCP - General (Physician Assistant)    PMHx, SurgHx, SocialHx, Medications, and Allergies were reviewed in the Visit Navigator and updated as appropriate.   Past Medical History:  Diagnosis Date   Atypical mole    followed by dermatology   Shingles 03/21/2016      Past Surgical History:  Procedure Laterality Date   FRACTURE SURGERY  2017   Nose   VASECTOMY  02/2023   WISDOM TOOTH EXTRACTION Bilateral 2013     Family History  Problem Relation Age of Onset   Depression Paternal Aunt    Alcohol abuse Paternal Uncle    Colon cancer Neg Hx    Prostate cancer Neg Hx    Rectal cancer Neg Hx    Stomach cancer Neg Hx     Social History   Tobacco Use   Smoking status: Never   Smokeless tobacco: Never  Vaping Use   Vaping status: Never Used  Substance Use Topics   Alcohol use: Yes    Comment: 6-8 drinks per week   Drug use: No    Review of Systems:   Review of Systems  Constitutional:  Negative for chills, fever, malaise/fatigue and weight loss.  HENT:  Negative for hearing loss, sinus pain and sore throat.   Respiratory:  Negative for cough and hemoptysis.   Cardiovascular:  Negative for chest pain, palpitations, leg swelling and PND.  Gastrointestinal:  Negative for abdominal pain, constipation, diarrhea, heartburn, nausea and vomiting.  Genitourinary:  Negative for dysuria, frequency and urgency.  Musculoskeletal:  Negative for back pain, myalgias and neck pain.  Skin:  Negative for itching and rash.  Neurological:  Negative for dizziness, tingling, seizures and headaches.  Endo/Heme/Allergies:  Negative for polydipsia.  Psychiatric/Behavioral:  Negative for depression. The patient is not nervous/anxious.       Objective:    Vitals:   06/27/23 0802  BP: 122/80  Pulse: 64  Temp: (!) 97.3 F (36.3 C)  SpO2: 99%    Body mass index is 24.37 kg/m.  General  Alert, cooperative, no distress, appears stated age  Head:  Normocephalic, without obvious abnormality, atraumatic  Eyes:  PERRL, conjunctiva/corneas clear, EOM's intact, fundi benign, both eyes       Ears:  Normal TM's and external ear canals, both ears  Nose: Nares normal, septum midline, mucosa normal, no drainage or sinus tenderness  Throat: Lips, mucosa,  and tongue normal; teeth and gums normal  Neck: Supple, symmetrical, trachea midline, no adenopathy;     thyroid:  No enlargement/tenderness/nodules; no carotid bruit or JVD  Back:   Symmetric, no curvature, ROM normal, no CVA tenderness  Lungs:   Clear to auscultation bilaterally, respirations unlabored  Chest wall:  No tenderness or deformity  Heart:  Regular rate and rhythm, S1 and S2 normal, no murmur, rub or gallop  Abdomen:   Soft, non-tender, bowel sounds active all four quadrants, no masses, no organomegaly  Extremities: Extremities normal, atraumatic, no cyanosis or edema  Prostate : Deferred   Skin: Skin color, texture, turgor normal, no rashes or lesions  Lymph nodes: Cervical, supraclavicular, and axillary nodes normal  Neurologic: CNII-XII grossly intact. Normal strength, sensation and reflexes throughout   AssessmentPlan:   Routine physical examination Today patient counseled on age appropriate routine health concerns for screening and prevention, each reviewed and up to date or declined. Immunizations reviewed and up to date or declined. Labs ordered and reviewed. Risk factors for depression reviewed and negative. Hearing function and visual acuity are intact. ADLs screened and addressed as needed. Functional ability and level of safety reviewed and appropriate. Education, counseling and referrals performed based on assessed risks today. Patient provided with a copy of personalized plan for preventive services.  Hyperlipidemia, unspecified hyperlipidemia type Update lipid panel and provide recommendations  Iron deficiency anemia Continue close follow up with hematology Avoid NSAIDs and blood donation for the time being    I, Bernita Bristle, acting as a Neurosurgeon for Alexander Iba, [place]., have documented all relevant documentation on the behalf of Alexander Iba, [place], as directed by   while in the presence of Alexander Iba, [place].  I, Alexander Iba, [place], have reviewed all  documentation for this visit. The documentation on 06/27/23 for the exam, diagnosis, procedures, and orders are all accurate and complete.  Alexander Iba, PA-C [facility]

## 2023-06-27 ENCOUNTER — Encounter: Payer: Self-pay | Admitting: Physician Assistant

## 2023-06-27 ENCOUNTER — Ambulatory Visit (INDEPENDENT_AMBULATORY_CARE_PROVIDER_SITE_OTHER): Payer: 59 | Admitting: Physician Assistant

## 2023-06-27 ENCOUNTER — Ambulatory Visit: Payer: Self-pay | Admitting: Physician Assistant

## 2023-06-27 VITALS — BP 122/80 | HR 64 | Temp 97.3°F | Ht 69.0 in | Wt 165.0 lb

## 2023-06-27 DIAGNOSIS — E785 Hyperlipidemia, unspecified: Secondary | ICD-10-CM

## 2023-06-27 DIAGNOSIS — Z0000 Encounter for general adult medical examination without abnormal findings: Secondary | ICD-10-CM

## 2023-06-27 DIAGNOSIS — D509 Iron deficiency anemia, unspecified: Secondary | ICD-10-CM | POA: Diagnosis not present

## 2023-06-27 LAB — CBC WITH DIFFERENTIAL/PLATELET
Basophils Absolute: 0 10*3/uL (ref 0.0–0.1)
Basophils Relative: 0.8 % (ref 0.0–3.0)
Eosinophils Absolute: 0 10*3/uL (ref 0.0–0.7)
Eosinophils Relative: 0.8 % (ref 0.0–5.0)
HCT: 48.7 % (ref 39.0–52.0)
Hemoglobin: 16.7 g/dL (ref 13.0–17.0)
Lymphocytes Relative: 35.8 % (ref 12.0–46.0)
Lymphs Abs: 1.7 10*3/uL (ref 0.7–4.0)
MCHC: 34.4 g/dL (ref 30.0–36.0)
MCV: 85.6 fl (ref 78.0–100.0)
Monocytes Absolute: 0.4 10*3/uL (ref 0.1–1.0)
Monocytes Relative: 9.4 % (ref 3.0–12.0)
Neutro Abs: 2.5 10*3/uL (ref 1.4–7.7)
Neutrophils Relative %: 53.2 % (ref 43.0–77.0)
Platelets: 188 10*3/uL (ref 150.0–400.0)
RBC: 5.68 Mil/uL (ref 4.22–5.81)
RDW: 13 % (ref 11.5–15.5)
WBC: 4.6 10*3/uL (ref 4.0–10.5)

## 2023-06-27 LAB — LIPID PANEL
Cholesterol: 203 mg/dL — ABNORMAL HIGH (ref 0–200)
HDL: 59.4 mg/dL (ref >39.00)
LDL Cholesterol: 131 mg/dL — ABNORMAL HIGH (ref 0–99)
NonHDL: 143.67
Total CHOL/HDL Ratio: 3
Triglycerides: 61 mg/dL (ref 0.0–149.0)
VLDL: 12.2 mg/dL (ref 0.0–40.0)

## 2023-06-27 LAB — COMPREHENSIVE METABOLIC PANEL WITH GFR
ALT: 13 U/L (ref 0–53)
AST: 19 U/L (ref 0–37)
Albumin: 4.7 g/dL (ref 3.5–5.2)
Alkaline Phosphatase: 46 U/L (ref 39–117)
BUN: 15 mg/dL (ref 6–23)
CO2: 32 meq/L (ref 19–32)
Calcium: 9.6 mg/dL (ref 8.4–10.5)
Chloride: 100 meq/L (ref 96–112)
Creatinine, Ser: 1.06 mg/dL (ref 0.40–1.50)
GFR: 89.11 mL/min (ref >60.00)
Glucose, Bld: 85 mg/dL (ref 70–99)
Potassium: 4.3 meq/L (ref 3.5–5.1)
Sodium: 138 meq/L (ref 135–145)
Total Bilirubin: 0.7 mg/dL (ref 0.2–1.2)
Total Protein: 7.4 g/dL (ref 6.0–8.3)

## 2023-06-27 LAB — HEMOGLOBIN A1C: Hgb A1c MFr Bld: 5.4 % (ref 4.6–6.5)

## 2023-06-27 NOTE — Patient Instructions (Signed)
 It was great to see you!  Please go to the lab for blood work.   Our office will call you with your results unless you have chosen to receive results via MyChart.  If your blood work is normal we will follow-up each year for physicals and as scheduled for chronic medical problems.  If anything is abnormal we will treat accordingly and get you in for a follow-up.  Take care,  Lelon Mast

## 2023-06-28 ENCOUNTER — Telehealth: Payer: Self-pay | Admitting: Oncology

## 2023-06-28 NOTE — Telephone Encounter (Signed)
 Patient has been scheduled for follow-up visit per 06/26/23 LOS.  Pt aware of scheduled appt details.

## 2023-10-10 ENCOUNTER — Inpatient Hospital Stay (HOSPITAL_BASED_OUTPATIENT_CLINIC_OR_DEPARTMENT_OTHER): Admitting: Oncology

## 2023-10-10 ENCOUNTER — Inpatient Hospital Stay: Attending: Oncology

## 2023-10-10 ENCOUNTER — Other Ambulatory Visit

## 2023-10-10 ENCOUNTER — Ambulatory Visit: Admitting: Oncology

## 2023-10-10 VITALS — BP 117/80 | HR 86 | Temp 97.8°F | Resp 18 | Ht 69.0 in | Wt 167.7 lb

## 2023-10-10 DIAGNOSIS — D509 Iron deficiency anemia, unspecified: Secondary | ICD-10-CM

## 2023-10-10 LAB — CBC WITH DIFFERENTIAL (CANCER CENTER ONLY)
Abs Immature Granulocytes: 0.01 K/uL (ref 0.00–0.07)
Basophils Absolute: 0 K/uL (ref 0.0–0.1)
Basophils Relative: 1 %
Eosinophils Absolute: 0 K/uL (ref 0.0–0.5)
Eosinophils Relative: 1 %
HCT: 46 % (ref 39.0–52.0)
Hemoglobin: 16.1 g/dL (ref 13.0–17.0)
Immature Granulocytes: 0 %
Lymphocytes Relative: 29 %
Lymphs Abs: 1.9 K/uL (ref 0.7–4.0)
MCH: 29.9 pg (ref 26.0–34.0)
MCHC: 35 g/dL (ref 30.0–36.0)
MCV: 85.3 fL (ref 80.0–100.0)
Monocytes Absolute: 0.4 K/uL (ref 0.1–1.0)
Monocytes Relative: 6 %
Neutro Abs: 4.2 K/uL (ref 1.7–7.7)
Neutrophils Relative %: 63 %
Platelet Count: 176 K/uL (ref 150–400)
RBC: 5.39 MIL/uL (ref 4.22–5.81)
RDW: 12.6 % (ref 11.5–15.5)
WBC Count: 6.6 K/uL (ref 4.0–10.5)
nRBC: 0 % (ref 0.0–0.2)

## 2023-10-10 LAB — FERRITIN: Ferritin: 61 ng/mL (ref 24–336)

## 2023-10-10 NOTE — Progress Notes (Signed)
  [facility] OFFICE PROGRESS NOTE   Diagnosis: Iron deficiency anemia  INTERVAL HISTORY:   Brian Brady returns as scheduled.  He feels well.  He remains off of iron therapy.  He is not donating blood.  No difficulty with bowel or bladder function.  No bleeding.  No complaint.  Objective:  Vital signs in last 24 hours:  Blood pressure 117/80, pulse 86, temperature 97.8 F (36.6 C), temperature source Temporal, resp. rate 18, height 5' 9 (1.753 m), weight 167 lb 11.2 oz (76.1 kg), SpO2 100%.  Resp: Lungs clear bilaterally Cardio: Regular rate and rhythm GI: No mass, no hepatosplenomegaly, nontender Vascular: No leg edema  Lab Results:  Lab Results  Component Value Date   WBC 6.6 10/10/2023   HGB 16.1 10/10/2023   HCT 46.0 10/10/2023   MCV 85.3 10/10/2023   PLT 176 10/10/2023   NEUTROABS 4.2 10/10/2023    CMP  Lab Results  Component Value Date   NA 138 06/27/2023   K 4.3 06/27/2023   CL 100 06/27/2023   CO2 32 06/27/2023   GLUCOSE 85 06/27/2023   BUN 15 06/27/2023   CREATININE 1.06 06/27/2023   CALCIUM 9.6 06/27/2023   PROT 7.4 06/27/2023   ALBUMIN 4.7 06/27/2023   AST 19 06/27/2023   ALT 13 06/27/2023   ALKPHOS 46 06/27/2023   BILITOT 0.7 06/27/2023     Medications: I have reviewed the patient's current medications.   Assessment/Plan: Iron deficiency anemia Stool positive for occult blood x3 October 2024 12/06/2022 colonoscopy-a few ulcers in the terminal ileum (chronic nonspecific mildly active ileitis, no dysplasia or malignancy).  Per Dr. Legrand the small ileal ulcers explain heme positive stool, not clear to what extent they are responsible for the iron deficiency anemia 12/06/2022 upper endoscopy-reflux esophagitis, very mild reflux related finding in the esophagus does not appear to explain heme positive stool or iron deficiency anemia Frequent blood donor-discontinued    Disposition: Brian Brady appears well.  He was diagnosed with iron  deficiency anemia last year.  The iron deficiency anemia is most likely related to frequent blood donation or potentially ileal ulcers noted on colonoscopy 12/06/2022.  He has been off of iron therapy for over 6 months and the hemoglobin, ferritin, and MCV remain normal.  He will continue clinical follow-up with Lucie Buttner.  He is not scheduled for a follow-up appointment in the hematology clinic.  I am available to see him as needed.  Arley Hof, MD  10/10/2023  10:39 AM

## 2024-02-08 ENCOUNTER — Other Ambulatory Visit: Payer: Self-pay

## 2024-02-08 ENCOUNTER — Ambulatory Visit
Admission: RE | Admit: 2024-02-08 | Discharge: 2024-02-08 | Disposition: A | Discharge status: Home / Self Care | Attending: Family Medicine | Admitting: Family Medicine

## 2024-02-08 VITALS — BP 126/77 | HR 98 | Temp 98.7°F | Resp 18

## 2024-02-08 DIAGNOSIS — R6889 Other general symptoms and signs: Secondary | ICD-10-CM

## 2024-02-08 LAB — POCT INFLUENZA A/B
Influenza A, POC: NEGATIVE
Influenza B, POC: NEGATIVE

## 2024-02-08 NOTE — Discharge Instructions (Signed)

## 2024-02-08 NOTE — ED Provider Notes (Signed)
 [facility]    CSN: [id number] Arrival date & time: 02/08/24  1025      History   Chief Complaint Chief Complaint  Patient presents with   Generalized Body Aches    Cough, chest tightness, head pressure, congestion, headache, aches. No fevers. Some Symptoms have been around a week (come and gone). Worst was later yesterday and today. Flu and other sickness in the house. - Entered by patient    HPI Brian Brady is a 39 y.o. male.   HPI Had some postnasal drip and nasal congestion 1 week ago which improved yesterday developed cough, chest congestion, headache, fatigue.  Household member with recent influenza A and RSV.  No confirmed COVID exposures.  No documented fever, chills, earache, sore throat, chest pain, shortness of breath, wheezing, abdominal pain, nausea, vomiting.  Denies history of asthma.  Past Medical History:  Diagnosis Date   Atypical mole    followed by dermatology   Shingles 03/21/2016    There are no active problems to display for this patient.   Past Surgical History:  Procedure Laterality Date   FRACTURE SURGERY  2017   Nose   VASECTOMY  02/2023   WISDOM TOOTH EXTRACTION Bilateral 2013       Home Medications    Prior to Admission medications  Medication Sig Start Date End Date Taking? Authorizing Provider  Multiple Vitamin (MULTIVITAMIN) tablet Take 1 tablet by mouth daily.   Yes [provider]  Omega-3 Fatty Acids (FISH OIL PO) Take by mouth.   Yes [provider]    Family History Family History  Problem Relation Age of Onset   Depression Paternal Aunt    Alcohol abuse Paternal Uncle    Colon cancer Neg Hx    Prostate cancer Neg Hx    Rectal cancer Neg Hx    Stomach cancer Neg Hx     Social History Social History[1]   Allergies   Amoxicillin   Review of Systems Review of Systems   Physical Exam Triage Vital Signs ED Triage Vitals  Encounter Vitals Group     BP 02/08/24 1033 126/77     Girls  Systolic BP Percentile --      Girls Diastolic BP Percentile --      Boys Systolic BP Percentile --      Boys Diastolic BP Percentile --      Pulse Rate 02/08/24 1035 98     Resp 02/08/24 1033 18     Temp 02/08/24 1035 98.7 F (37.1 C)     Temp Source 02/08/24 1035 Oral     SpO2 02/08/24 1033 98 %     Weight --      Height --      Head Circumference --      Peak Flow --      Pain Score --      Pain Loc --      Pain Education --      Exclude from Growth Chart --    No data found.  Updated Vital Signs BP 126/77 (BP Location: Right Arm)   Pulse 98   Temp 98.7 F (37.1 C) (Oral)   Resp 18   SpO2 98%   Visual Acuity Right Eye Distance:   Left Eye Distance:   Bilateral Distance:    Right Eye Near:   Left Eye Near:    Bilateral Near:     Physical Exam Vitals and nursing note reviewed.  Constitutional:  Appearance: He is diaphoretic. He is not ill-appearing.  HENT:     Head: Normocephalic and atraumatic.     Right Ear: Tympanic membrane and ear canal normal.     Left Ear: Tympanic membrane and ear canal normal.     Nose: No rhinorrhea.     Mouth/Throat:     Mouth: Mucous membranes are moist.     Pharynx: Oropharynx is clear. No oropharyngeal exudate or posterior oropharyngeal erythema.  Eyes:     Conjunctiva/sclera: Conjunctivae normal.  Cardiovascular:     Rate and Rhythm: Normal rate and regular rhythm.     Heart sounds: Normal heart sounds.  Pulmonary:     Effort: Pulmonary effort is normal. No respiratory distress.     Breath sounds: No wheezing, rhonchi or rales.  Musculoskeletal:     Cervical back: Neck supple.  Lymphadenopathy:     Cervical: No cervical adenopathy.  Skin:    General: Skin is warm.  Neurological:     Mental Status: He is alert and oriented to person, place, and time.      UC Treatments / Results  Labs (all labs ordered are listed, but only abnormal results are displayed) Labs Reviewed  POCT INFLUENZA A/B - Normal     EKG   Radiology No results found.  Procedures Procedures (including critical care time)  Medications Ordered in UC Medications - No data to display  Initial Impression / Assessment and Plan / UC Course  I have reviewed the triage vital signs and the nursing notes.  Pertinent labs & imaging results that were available during my care of the patient were reviewed by me and considered in my medical decision making (see chart for details).     39 year old male recent flu exposure with illness since yesterday including cough, chest congestion headache and fatigue.  He is nontoxic-appearing, afebrile, vital signs are stable, no evidence of bacterial infection on exam.  Point-of-care flu test is negative.  Discussed with patient likely viral etiology recommend OTC meds for symptomatic relief, follow-up as needed Final Clinical Impressions(s) / UC Diagnoses   Final diagnoses:  Flu-like symptoms     Discharge Instructions      Supportive Care Medications  These are medications that may help with your symptoms. However, Please note that if your PCP has told you not to use certain products please follow his/her recommendations.  For example: - if you have high BP you should use something similar to Coricidin HPB, not Sudafed or antihistamines with a D such as Allegra D. The D means decongestant.   -Higher Doses of NSAIDS (Ibuprofen, Aleve etc) with Kidney disease or poorly controlled BP.  Fever, Body aches, Headache  Ibuprofen 200 mg 2 tablets and Acetaminophen 2 tabs 4 times a day just before meals and at bedtime (every 6 hours)    Do not use NSAIDS if you are allergic to NSAIDS, if you have been given oral steroids-prednisone, methylprednisolone, dexamethasone until your steroids are finished, if you are pregnant or breast feeding, or if you have history of kidney or liver disease.   Sore Throat: Cepacol throat lozenges or spray  Cough Drops Warm salt water  gargles  How to make saltwater rinses Use warm water, because warmth is more relieving to a sore throat than cold water. Warm water will also help the salt dissolve into the water more effectively. Use any type of salt you have available. Most saltwater rinse recipes call for 8 ounces of warm water and 1 teaspoon  of salt. However, if your mouth is tender and the saltwater rinse stings, decrease the salt to a 1/2 teaspoon for the first 1 to 2 days. Bring water to a boil, then remove from heat, add salt, and stir. Let the saltwater cool to a warm temperature before rinsing with it. Once you have finished your rinse, discard leftover solution to avoid contamination.  Nasal Congestion: Flonase nasal spray as directed on the package. Oral decongestants like sudafed but only if you do not have high Blood Pressure if you have high Blood Pressure take CORICIDIN HBP Antihistamines Nasal Saline/Neti Pot  Cough: Expectorants (guaifenesin) help thin mucus making it easier to get up. This should be used during the day. During the day coughing helps bring mucus up out of your lungs Suppressants (dextromethorphan) just for bedtime so you can sleep   Oral rehydration is important when you have been sweating more than usual and/or having nausea and vomiting. You can use over the counter rehydration supplements like Pedialyte sport, Gatorlyte, Electrolit, Liquid IV, or you can make your own at home. Recipe:  Mix 1 liter of clean or boiled water with 6 teaspoons (2 tablespoons) of sugar and 1/2 tsp of salt. Stir until both dissolve.  You can add sugar free flavoring (i.e. Crystal light) if desired.  Drink small sips frequently rather than large amounts at once to prevent nausea.       ED Prescriptions   None    PDMP not reviewed this encounter.    [1]  Social History Tobacco Use   Smoking status: Never   Smokeless tobacco: Never  Vaping Use   Vaping status: Never Used  Substance Use Topics    Alcohol use: Yes    Comment: 6-8 drinks per week   Drug use: No     Nolyn Swab, PA 02/08/24 1058  "

## 2024-02-08 NOTE — ED Triage Notes (Addendum)
 Patient states he started with post nasal drip. Cough and congestion.  Patient states his son tested positive for flu. Pt is taking Mucinex DM.

## 2024-06-30 ENCOUNTER — Encounter: Admitting: Physician Assistant
